# Patient Record
Sex: Female | Born: 1985
Health system: Southern US, Community
[De-identification: ages and names within clinical notes are randomized; demographics above are authoritative.]

## PROBLEM LIST (undated history)

## (undated) ENCOUNTER — Ambulatory Visit: Payer: No Typology Code available for payment source | Source: Home / Self Care

## (undated) DIAGNOSIS — G4733 Obstructive sleep apnea (adult) (pediatric): Secondary | ICD-10-CM

## (undated) DIAGNOSIS — R7989 Other specified abnormal findings of blood chemistry: Secondary | ICD-10-CM

## (undated) DIAGNOSIS — N946 Dysmenorrhea, unspecified: Secondary | ICD-10-CM

## (undated) DIAGNOSIS — Z8719 Personal history of other diseases of the digestive system: Secondary | ICD-10-CM

## (undated) DIAGNOSIS — M1712 Unilateral primary osteoarthritis, left knee: Secondary | ICD-10-CM

## (undated) DIAGNOSIS — E119 Type 2 diabetes mellitus without complications: Secondary | ICD-10-CM

## (undated) DIAGNOSIS — Z862 Personal history of diseases of the blood and blood-forming organs and certain disorders involving the immune mechanism: Secondary | ICD-10-CM

## (undated) DIAGNOSIS — J45909 Unspecified asthma, uncomplicated: Secondary | ICD-10-CM

## (undated) DIAGNOSIS — A64 Unspecified sexually transmitted disease: Secondary | ICD-10-CM

## (undated) DIAGNOSIS — A599 Trichomoniasis, unspecified: Secondary | ICD-10-CM

## (undated) DIAGNOSIS — R87619 Unspecified abnormal cytological findings in specimens from cervix uteri: Secondary | ICD-10-CM

## (undated) HISTORY — DX: Trichomoniasis, unspecified: A59.9

## (undated) HISTORY — DX: Other specified abnormal findings of blood chemistry: R79.89

## (undated) HISTORY — DX: Unspecified sexually transmitted disease: A64

## (undated) HISTORY — DX: Dysmenorrhea, unspecified: N94.6

## (undated) HISTORY — PX: KNEE SURGERY: SHX244

## (undated) HISTORY — PX: HERNIA REPAIR: SHX51

## (undated) HISTORY — DX: Unilateral primary osteoarthritis, left knee: M17.12

## (undated) HISTORY — DX: Unspecified abnormal cytological findings in specimens from cervix uteri: R87.619

## (undated) HISTORY — PX: LAPAROSCOPIC GASTRIC BANDING: SHX1100

## (undated) HISTORY — PX: FOOT SURGERY: SHX648

## (undated) HISTORY — PX: CHOLECYSTECTOMY: SHX55

---

## 2017-06-22 ENCOUNTER — Emergency Department (HOSPITAL_COMMUNITY): Payer: Medicaid Other

## 2017-06-22 ENCOUNTER — Encounter (HOSPITAL_COMMUNITY): Payer: Self-pay

## 2017-06-22 ENCOUNTER — Emergency Department (HOSPITAL_COMMUNITY)
Admission: EM | Admit: 2017-06-22 | Discharge: 2017-06-22 | Disposition: A | Discharge status: Home / Self Care | Payer: Medicaid Other | Attending: Emergency Medicine | Admitting: Emergency Medicine

## 2017-06-22 DIAGNOSIS — R1110 Vomiting, unspecified: Secondary | ICD-10-CM | POA: Diagnosis not present

## 2017-06-22 DIAGNOSIS — Z79899 Other long term (current) drug therapy: Secondary | ICD-10-CM | POA: Insufficient documentation

## 2017-06-22 DIAGNOSIS — E119 Type 2 diabetes mellitus without complications: Secondary | ICD-10-CM | POA: Diagnosis not present

## 2017-06-22 DIAGNOSIS — Z4651 Encounter for fitting and adjustment of gastric lap band: Secondary | ICD-10-CM | POA: Diagnosis not present

## 2017-06-22 DIAGNOSIS — J45909 Unspecified asthma, uncomplicated: Secondary | ICD-10-CM | POA: Insufficient documentation

## 2017-06-22 HISTORY — DX: Type 2 diabetes mellitus without complications: E11.9

## 2017-06-22 HISTORY — DX: Unspecified asthma, uncomplicated: J45.909

## 2017-06-22 LAB — CBC WITH DIFFERENTIAL/PLATELET
Basophils Absolute: 0 10*3/uL (ref 0.0–0.1)
Basophils Relative: 0 %
Eosinophils Absolute: 0.1 10*3/uL (ref 0.0–0.7)
Eosinophils Relative: 1 %
HCT: 40.1 % (ref 36.0–46.0)
Hemoglobin: 13.7 g/dL (ref 12.0–15.0)
Lymphocytes Relative: 47 %
Lymphs Abs: 3.1 10*3/uL (ref 0.7–4.0)
MCH: 29.8 pg (ref 26.0–34.0)
MCHC: 34.2 g/dL (ref 30.0–36.0)
MCV: 87.2 fL (ref 78.0–100.0)
Monocytes Absolute: 0.5 10*3/uL (ref 0.1–1.0)
Monocytes Relative: 8 %
Neutro Abs: 2.9 10*3/uL (ref 1.7–7.7)
Neutrophils Relative %: 44 %
Platelets: 211 10*3/uL (ref 150–400)
RBC: 4.6 MIL/uL (ref 3.87–5.11)
RDW: 14.6 % (ref 11.5–15.5)
WBC: 6.5 10*3/uL (ref 4.0–10.5)

## 2017-06-22 LAB — COMPREHENSIVE METABOLIC PANEL
ALT: 12 U/L — ABNORMAL LOW (ref 14–54)
AST: 16 U/L (ref 15–41)
Albumin: 4.3 g/dL (ref 3.5–5.0)
Alkaline Phosphatase: 42 U/L (ref 38–126)
Anion gap: 9 (ref 5–15)
BUN: 8 mg/dL (ref 6–20)
CO2: 24 mmol/L (ref 22–32)
Calcium: 9.3 mg/dL (ref 8.9–10.3)
Chloride: 106 mmol/L (ref 101–111)
Creatinine, Ser: 0.97 mg/dL (ref 0.44–1.00)
GFR calc Af Amer: >60 mL/min (ref >60)
GFR calc non Af Amer: >60 mL/min (ref >60)
Glucose, Bld: 91 mg/dL (ref 65–99)
Potassium: 3.6 mmol/L (ref 3.5–5.1)
Sodium: 139 mmol/L (ref 135–145)
Total Bilirubin: 1.2 mg/dL (ref 0.3–1.2)
Total Protein: 7.7 g/dL (ref 6.5–8.1)

## 2017-06-22 LAB — LIPASE, BLOOD: Lipase: 13 U/L (ref 11–51)

## 2017-06-22 LAB — POC URINE PREG, ED: Preg Test, Ur: NEGATIVE

## 2017-06-22 MED ORDER — LIDOCAINE HCL (PF) 1 % IJ SOLN
10.0000 mg | Freq: Once | INTRAMUSCULAR | Status: AC
  Administered 2017-06-22: 10 mg
  Filled 2017-06-22: qty 30

## 2017-06-22 MED ORDER — THIAMINE HCL 100 MG/ML IJ SOLN
Freq: Once | INTRAVENOUS | Status: AC
  Administered 2017-06-22: 17:00:00 via INTRAVENOUS
  Filled 2017-06-22: qty 1000

## 2017-06-22 NOTE — ED Notes (Addendum)
Pt declining blood draw or IV placement. States that she wants to speak to the doctor again. MD Criss AlvineGoldston made aware.

## 2017-06-22 NOTE — ED Notes (Signed)
ED Provider at bedside. 

## 2017-06-22 NOTE — ED Notes (Signed)
MD in room

## 2017-06-22 NOTE — ED Notes (Signed)
Pt aware urine sample is needed and cup has been provided

## 2017-06-22 NOTE — ED Notes (Signed)
Pt now agrees to blood draw and IV.

## 2017-06-22 NOTE — ED Notes (Addendum)
Pt ambulated to CT

## 2017-06-22 NOTE — ED Provider Notes (Signed)
[facility]-EMERGENCY DEPT Provider Note   CSN: [id number] Arrival date & time: 06/22/17  1409     History   Chief Complaint Chief Complaint  Patient presents with  . Emesis    HPI Julia Davis is a 31 y.o. female.  HPI  31 year old female with vomiting for about one week. She states she had a LAP-BAND in 2013 in [place]. 3 weeks ago she had it filled. She waited about one week before moving down here because she has had issues with it being overfilled before. She had no symptoms and thus moved down here without difficulty. However over the last 1-2 weeks she's been having progressive trouble swallowing. Over the last couple days it's been to the point that she cannot swallow water. She states the water feels like it goes all the way down her chest into her stomach and then comes back up. She denies any abdominal pain or distention. No hematemesis.  Past Medical History:  Diagnosis Date  . Asthma    mild  . Diabetes mellitus without complication (HCC)    gestational only    There are no active problems to display for this patient.   Past Surgical History:  Procedure Laterality Date  . CESAREAN SECTION    . CHOLECYSTECTOMY    . FOOT SURGERY Left   . KNEE SURGERY Right   . LAPAROSCOPIC GASTRIC BANDING      OB History    No data available       Home Medications    Prior to Admission medications   Medication Sig Start Date End Date Taking? Authorizing Provider  acetaminophen-codeine (TYLENOL #3) 300-30 MG tablet Take 1 tablet by mouth every 4 (four) hours as needed for moderate pain.   Yes [provider]  cetirizine (ZYRTEC) 10 MG tablet Take 10 mg by mouth daily.   Yes [provider]  levonorgestrel (MIRENA) 20 MCG/24HR IUD 1 each by Intrauterine route once.   Yes [provider]    Family History History reviewed. No pertinent family history.  Social History Social History  Substance Use Topics  . Smoking status: Never Smoker  .  Smokeless tobacco: Never Used  . Alcohol use 1.2 oz/week    2 Glasses of wine per week     Allergies   Patient has no known allergies.   Review of Systems Review of Systems  Constitutional: Negative for fever.  Gastrointestinal: Positive for vomiting. Negative for abdominal distention, abdominal pain and nausea.  Genitourinary: Negative for menstrual problem.  Musculoskeletal: Negative for back pain.  All other systems reviewed and are negative.    Physical Exam Updated Vital Signs BP (!) 150/104 (BP Location: Left Arm)   Pulse (!) 57   Temp 98.5 F (36.9 C) (Oral)   Resp 14   Ht 5\' 9"  (1.753 m)   Wt 122.5 kg (270 lb)   LMP 05/29/2017 (Approximate) Comment: Patient has the mirena IUD. pt has not had sex since before her last period.  SpO2 100%   BMI 39.87 kg/m   Physical Exam  Constitutional: She is oriented to person, place, and time. She appears well-developed and well-nourished. No distress.  obese  HENT:  Head: Normocephalic and atraumatic.  Right Ear: External ear normal.  Left Ear: External ear normal.  Nose: Nose normal.  Eyes: Right eye exhibits no discharge. Left eye exhibits no discharge.  Cardiovascular: Normal rate, regular rhythm and normal heart sounds.   Pulmonary/Chest: Effort normal and breath sounds normal.  Abdominal: Soft. She  exhibits no distension. There is no tenderness.  LUQ scar, well healed. LUQ subcutaneous mass present (c/w lap band), no tenderness  Neurological: She is alert and oriented to person, place, and time.  Skin: Skin is warm and dry. She is not diaphoretic.  Nursing note and vitals reviewed.    ED Treatments / Results  Labs (all labs ordered are listed, but only abnormal results are displayed) Labs Reviewed  COMPREHENSIVE METABOLIC PANEL - Abnormal; Notable for the following:       Result Value   ALT 12 (*)    All other components within normal limits  LIPASE, BLOOD  CBC WITH DIFFERENTIAL/PLATELET  POC URINE PREG,  ED    EKG  EKG Interpretation None       Radiology Dg Abd 1 View  Result Date: 06/22/2017 CLINICAL DATA:  31 year old with personal history of a laparoscopic gastric band procedure, presenting with upper abdominal pain, nausea and vomiting. Patient believes that the band may be too tight. EXAM: ABDOMEN - 1 VIEW COMPARISON:  None. FINDINGS: The laparoscopic band is appropriately oriented in the 1:30 o'clock to the 7:30 o'clock position. The catheter from the subcutaneous port in the left mid abdomen to the band is intact. Bowel gas pattern unremarkable without evidence of obstruction or significant ileus. Expected stool burden in the colon. Phleboliths in the left side of the pelvis. No visible opaque urinary tract calculate. T-shaped intrauterine device in the midline of the pelvis. Regional skeleton intact. IMPRESSION: No acute abdominal abnormality. No visible complicating features related to the laparoscopic gastric band. Electronically Signed   By: Hulan Saashomas  Lawrence M.D.   On: 06/22/2017 16:28    Procedures Procedures (including critical care time)  Medications Ordered in ED Medications  sodium chloride 0.9 % 1,000 mL with thiamine 100 mg, folic acid 1 mg, multivitamins adult 10 mL infusion ( Intravenous New Bag/Given 06/22/17 1640)  lidocaine (PF) (XYLOCAINE) 1 % injection 10 mg (10 mg Other Given 06/22/17 1732)     Initial Impression / Assessment and Plan / ED Course  I have reviewed the triage vital signs and the nursing notes.  Pertinent labs & imaging results that were available during my care of the patient were reviewed by me and considered in my medical decision making (see chart for details).     Patient's presentation is most consistent with overinflation of the lap band. Discussed with Dr. Carolynne Edouardoth, who evaluated patient and removed fluid from her lap band. She is now able to tolerate oral fluids without difficulty. Thus, she will be discharged to follow-up with her [place]  bariatric surgeon, she is going back up there before the end of the month. Otherwise well appearing. Labwork unremarkable, is not pregnant. No indication for CT at this time given resolution of symptoms. Discussed return precautions.  Final Clinical Impressions(s) / ED Diagnoses   Final diagnoses:  Encounter for adjustment of gastric lap band  Vomiting in adult    New Prescriptions New Prescriptions   No medications on file     Pricilla LovelessGoldston, Zonie Crutcher, MD 06/22/17 [phone]

## 2017-06-22 NOTE — Consult Note (Signed)
Reason for Consult:not able to swallow Referring Physician: Dr. Tonna Boehringer is an 31 y.o. female.  HPI: The patient is a 31 yo black female who had a lap band placed 5 years ago in [place]. She had a fill about 3 weeks ago. Then she moved down here. Over last week she has been having difficulty swallowing. Denies pain. Now can't swallow liquid.  Past Medical History:  Diagnosis Date  . Asthma    mild  . Diabetes mellitus without complication (HCC)    gestational only    Past Surgical History:  Procedure Laterality Date  . CESAREAN SECTION    . CHOLECYSTECTOMY    . FOOT SURGERY Left   . KNEE SURGERY Right   . LAPAROSCOPIC GASTRIC BANDING      History reviewed. No pertinent family history.  Social History:  reports that she has never smoked. She has never used smokeless tobacco. She reports that she drinks about 1.2 oz of alcohol per week . She reports that she does not use drugs.  Allergies: No Known Allergies  Medications: I have reviewed the patient's current medications.  Results for orders placed or performed during the hospital encounter of 06/22/17 (from the past 48 hour(s))  Lipase, blood     Status: None   Collection Time: 06/22/17  4:28 PM  Result Value Ref Range   Lipase 13 11 - 51 U/L  Comprehensive metabolic panel     Status: Abnormal   Collection Time: 06/22/17  4:28 PM  Result Value Ref Range   Sodium 139 135 - 145 mmol/L   Potassium 3.6 3.5 - 5.1 mmol/L   Chloride 106 101 - 111 mmol/L   CO2 24 22 - 32 mmol/L   Glucose, Bld 91 65 - 99 mg/dL   BUN 8 6 - 20 mg/dL   Creatinine, Ser 0.97 0.44 - 1.00 mg/dL   Calcium 9.3 8.9 - 10.3 mg/dL   Total Protein 7.7 6.5 - 8.1 g/dL   Albumin 4.3 3.5 - 5.0 g/dL   AST 16 15 - 41 U/L   ALT 12 (L) 14 - 54 U/L   Alkaline Phosphatase 42 38 - 126 U/L   Total Bilirubin 1.2 0.3 - 1.2 mg/dL   GFR calc non Af Amer >60 >60 mL/min   GFR calc Af Amer >60 >60 mL/min    Comment: (NOTE) The eGFR has been calculated  using the CKD EPI equation. This calculation has not been validated in all clinical situations. eGFR's persistently <60 mL/min signify possible Chronic Kidney Disease.    Anion gap 9 5 - 15  CBC with Differential     Status: None   Collection Time: 06/22/17  4:28 PM  Result Value Ref Range   WBC 6.5 4.0 - 10.5 K/uL   RBC 4.60 3.87 - 5.11 MIL/uL   Hemoglobin 13.7 12.0 - 15.0 g/dL   HCT 40.1 36.0 - 46.0 %   MCV 87.2 78.0 - 100.0 fL   MCH 29.8 26.0 - 34.0 pg   MCHC 34.2 30.0 - 36.0 g/dL   RDW 14.6 11.5 - 15.5 %   Platelets 211 150 - 400 K/uL   Neutrophils Relative % 44 %   Neutro Abs 2.9 1.7 - 7.7 K/uL   Lymphocytes Relative 47 %   Lymphs Abs 3.1 0.7 - 4.0 K/uL   Monocytes Relative 8 %   Monocytes Absolute 0.5 0.1 - 1.0 K/uL   Eosinophils Relative 1 %   Eosinophils Absolute 0.1 0.0 - 0.7  K/uL   Basophils Relative 0 %   Basophils Absolute 0.0 0.0 - 0.1 K/uL    Dg Abd 1 View  Result Date: 06/22/2017 CLINICAL DATA:  31 year old with personal history of a laparoscopic gastric band procedure, presenting with upper abdominal pain, nausea and vomiting. Patient believes that the band may be too tight. EXAM: ABDOMEN - 1 VIEW COMPARISON:  None. FINDINGS: The laparoscopic band is appropriately oriented in the 1:30 o'clock to the 7:30 o'clock position. The catheter from the subcutaneous port in the left mid abdomen to the band is intact. Bowel gas pattern unremarkable without evidence of obstruction or significant ileus. Expected stool burden in the colon. Phleboliths in the left side of the pelvis. No visible opaque urinary tract calculate. T-shaped intrauterine device in the midline of the pelvis. Regional skeleton intact. IMPRESSION: No acute abdominal abnormality. No visible complicating features related to the laparoscopic gastric band. Electronically Signed   By: Evangeline Dakin M.D.   On: 06/22/2017 16:28    Review of Systems  Constitutional: Negative.   HENT: Negative.   Eyes:  Negative.   Respiratory: Negative.   Cardiovascular: Negative.   Gastrointestinal: Positive for nausea and vomiting. Negative for abdominal pain.  Genitourinary: Negative.   Musculoskeletal: Negative.   Skin: Negative.   Neurological: Negative.   Endo/Heme/Allergies: Negative.   Psychiatric/Behavioral: Negative.    Blood pressure 107/82, pulse 67, temperature 98.5 F (36.9 C), temperature source Oral, resp. rate 14, height _0  (1.753 m), weight 122.5 kg (270 lb), last menstrual period 05/29/2017, SpO2 100 %. Physical Exam  Constitutional: She is oriented to person, place, and time. She appears well-developed and well-nourished. No distress.  HENT:  Head: Normocephalic and atraumatic.  Mouth/Throat: No oropharyngeal exudate.  Eyes: Conjunctivae and EOM are normal. Pupils are equal, round, and reactive to light.  Neck: Normal range of motion. Neck supple.  Cardiovascular: Normal rate, regular rhythm and normal heart sounds.   Respiratory: Effort normal and breath sounds normal. No stridor. No respiratory distress.  GI: Soft. Bowel sounds are normal. There is no tenderness.  Musculoskeletal: Normal range of motion. She exhibits no edema or deformity.  Neurological: She is alert and oriented to person, place, and time. Coordination normal.  Skin: Skin is warm and dry. No rash noted.  Psychiatric: She has a normal mood and affect. Her behavior is normal. Thought content normal.    Assessment/Plan: The patient may have too much fluid in band. I prepped her skin with chlorhexidine and aspirated 8cc of fluid from band. If she can swallow she can go home with follow up to her bariatric doctor.  TOTH III,Carleigh Buccieri S 06/22/2017, 5:24 PM

## 2017-06-22 NOTE — ED Notes (Signed)
Pt reminded of need for urine sample.  

## 2017-06-22 NOTE — ED Notes (Signed)
TOTH MD Provider at bedside.

## 2017-06-22 NOTE — ED Triage Notes (Signed)
Pt complains of vomiting any time she eats. This has been going on for a week, she came in today because she is not longer able to keep liquids down. Denies nausea or pain. Patient has a lap band placed in 2013.

## 2018-12-18 DIAGNOSIS — R7989 Other specified abnormal findings of blood chemistry: Secondary | ICD-10-CM

## 2018-12-18 DIAGNOSIS — A599 Trichomoniasis, unspecified: Secondary | ICD-10-CM

## 2018-12-18 HISTORY — DX: Trichomoniasis, unspecified: A59.9

## 2018-12-18 HISTORY — DX: Other specified abnormal findings of blood chemistry: R79.89

## 2019-01-15 ENCOUNTER — Other Ambulatory Visit (HOSPITAL_COMMUNITY)
Admission: RE | Admit: 2019-01-15 | Discharge: 2019-01-15 | Disposition: A | Discharge status: Home / Self Care | Payer: 59 | Source: Ambulatory Visit | Attending: Obstetrics and Gynecology | Admitting: Obstetrics and Gynecology

## 2019-01-15 ENCOUNTER — Encounter: Payer: Self-pay | Admitting: Obstetrics and Gynecology

## 2019-01-15 ENCOUNTER — Ambulatory Visit (INDEPENDENT_AMBULATORY_CARE_PROVIDER_SITE_OTHER): Payer: 59 | Admitting: Obstetrics and Gynecology

## 2019-01-15 ENCOUNTER — Telehealth: Payer: Self-pay | Admitting: Obstetrics and Gynecology

## 2019-01-15 ENCOUNTER — Other Ambulatory Visit: Payer: Self-pay

## 2019-01-15 VITALS — BP 120/84 | HR 70 | Resp 14 | Ht 67.5 in | Wt 304.6 lb

## 2019-01-15 DIAGNOSIS — Z01419 Encounter for gynecological examination (general) (routine) without abnormal findings: Secondary | ICD-10-CM

## 2019-01-15 DIAGNOSIS — E559 Vitamin D deficiency, unspecified: Secondary | ICD-10-CM | POA: Diagnosis not present

## 2019-01-15 DIAGNOSIS — Z113 Encounter for screening for infections with a predominantly sexual mode of transmission: Secondary | ICD-10-CM | POA: Diagnosis present

## 2019-01-15 DIAGNOSIS — T8332XA Displacement of intrauterine contraceptive device, initial encounter: Secondary | ICD-10-CM

## 2019-01-15 DIAGNOSIS — Z1159 Encounter for screening for other viral diseases: Secondary | ICD-10-CM | POA: Insufficient documentation

## 2019-01-15 MED ORDER — VALACYCLOVIR HCL 500 MG PO TABS: 500.0000 mg | ORAL_TABLET | Freq: Two times a day (BID) | ORAL | 3 refills | Status: DC

## 2019-01-15 NOTE — Telephone Encounter (Signed)
Patient returning call.

## 2019-01-15 NOTE — Progress Notes (Signed)
33 y.o. [id number] Single African American female here for annual exam.    Patient also complaining of unusual vaginal discharge with odor. Wants a pap and STD check.  Had unprotected sex.   Thinking about tubal ligation.  Feels moody with her Mirena IUD.  Declines future childbearing.   Light menses with Mirena, every 28 - 30 days.   Gained 30 pounds since moving to [place].  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  Studying business administration.  Moved from [place].  PCP: None  Patient's last menstrual period was 01/01/2019 (approximate).           Sexually active: Yes.   Female preference. The current method of family planning is IUD--Mirena inserted ~07/2016.    Exercising: Yes.    cardio and strength Smoker:  Yes, smokes 3-4 cigarettes/day  Health Maintenance: Pap:  2018 normal per patient in [place] History of abnormal Pap:  Yes, had abnormal pap age 33 with negative colposcopy--no treatment. MMG:  n/a Colonoscopy:  n/a BMD:   n/a  Result  n/a TDaP:  05/2018 Gardasil:   no HIV: 4-5 years ago--Neg Hep C: 4-5 years ago--Neg Screening Labs:  ---   reports that she has been smoking cigarettes. She has been smoking about 0.25 packs per day. She has never used smokeless tobacco. She reports current alcohol use of about 5.0 standard drinks of alcohol per week. She reports current drug use. Drug: Marijuana.  Past Medical History:  Diagnosis Date  . Abnormal Pap smear of cervix    age 60-neg colposcopy--no treatment  . Arthritis of knee, left   . Asthma    mild  . Diabetes mellitus without complication (HCC)    gestational only  . Dysmenorrhea   . STD (sexually transmitted disease)    Hx HSV II    Past Surgical History:  Procedure Laterality Date  . CESAREAN SECTION  2006, 2012   delivered in [place]  . CHOLECYSTECTOMY    . FOOT SURGERY Left   . KNEE SURGERY Right   . LAPAROSCOPIC GASTRIC BANDING      Current Outpatient Medications  Medication Sig Dispense Refill  .  levonorgestrel (MIRENA) 20 MCG/24HR IUD 1 each by Intrauterine route once.     No current facility-administered medications for this visit.     Family History  Problem Relation Age of Onset  . Osteoarthritis Mother   . Diabetes Father   . Diabetes Maternal Grandfather   . Hypertension Maternal Grandfather     Review of Systems  All other systems reviewed and are negative.   Exam:   BP 120/84 (BP Location: Right Arm, Patient Position: Sitting, Cuff Size: Large)   Pulse 70   Resp 14   Ht 5' 7.5" (1.715 m)   Wt (!) 304 lb 9.6 oz (138.2 kg)   LMP 01/01/2019 (Approximate)   BMI 47.00 kg/m     General appearance: alert, cooperative and appears stated age Head: Normocephalic, without obvious abnormality, atraumatic Neck: no adenopathy, supple, symmetrical, trachea midline and thyroid normal to inspection and palpation Lungs: clear to auscultation bilaterally Breasts: normal appearance, no masses or tenderness, No nipple retraction or dimpling, No nipple discharge or bleeding, No axillary or supraclavicular adenopathy Heart: regular rate and rhythm Abdomen: soft, non-tender; no masses, no organomegaly Extremities: extremities normal, atraumatic, no cyanosis or edema Skin: Skin color, texture, turgor normal. No rashes or lesions Lymph nodes: Cervical, supraclavicular, and axillary nodes normal. No abnormal inguinal nodes palpated Neurologic: Grossly normal  Pelvic: External genitalia:  no lesions              Urethra:  normal appearing urethra with no masses, tenderness or lesions              Bartholins and Skenes: normal                 Vagina: normal appearing vagina with normal color and discharge, no lesions              Cervix: no lesions.  Strings not seen.              Pap taken: Yes.   Bimanual Exam:  Uterus:  normal size, contour, position, consistency, mobility, non-tender              Adnexa: no mass, fullness, tenderness             Chaperone was present for  exam.  Assessment:   Well woman visit with normal exam. Mirena IUD.  Location of IUD? Remote hx abnormal pap.  Vaginitis.  Hx HSV II.  Rare outbreak.  Smoker.  Status post lap band.   Plan: Mammogram screening. Recommended self breast awareness. Pap and HR HPV as above. Guidelines for Calcium, Vitamin D, regular exercise program including cardiovascular and weight bearing exercise. Rx for valtrex prn.  STD screening.  Routine labs.  Vaginitis testing.  Return for pelvic ultrasound to confirm IUD location.  Discussed flu vaccine.  Follow up annually and prn.    After visit summary provided.

## 2019-01-15 NOTE — Patient Instructions (Signed)

## 2019-01-15 NOTE — Progress Notes (Signed)
Patient scheduled while in office for PUS on 01/16/19 at 10:30am, consult to follow at 11am with Dr. Edward Jolly. Order placed for precert. Patient verbalizes understanding and is agreeable.

## 2019-01-15 NOTE — Telephone Encounter (Signed)
Call to patient. Patient called to cancel ultrasound appointment for tomorrow.  Stressed importance of pelvic ultrasound to visualize IUD location and position.  Offered payment options and alternate locations.  Patient will call [facility] for estimate and let me know how to proceed.

## 2019-01-16 ENCOUNTER — Other Ambulatory Visit: Payer: 59

## 2019-01-16 ENCOUNTER — Other Ambulatory Visit: Payer: Self-pay | Admitting: Obstetrics and Gynecology

## 2019-01-17 LAB — CBC
Hematocrit: 42 % (ref 34.0–46.6)
Hemoglobin: 13.9 g/dL (ref 11.1–15.9)
MCH: 30.3 pg (ref 26.6–33.0)
MCHC: 33.1 g/dL (ref 31.5–35.7)
MCV: 92 fL (ref 79–97)
Platelets: 216 10*3/uL (ref 150–450)
RBC: 4.59 x10E6/uL (ref 3.77–5.28)
RDW: 13.9 % (ref 11.7–15.4)
WBC: 5.1 10*3/uL (ref 3.4–10.8)

## 2019-01-17 LAB — COMPREHENSIVE METABOLIC PANEL
ALT: 14 IU/L (ref 0–32)
AST: 17 IU/L (ref 0–40)
Albumin/Globulin Ratio: 1.8 (ref 1.2–2.2)
Albumin: 4.4 g/dL (ref 3.8–4.8)
Alkaline Phosphatase: 52 IU/L (ref 39–117)
BUN/Creatinine Ratio: 11 (ref 9–23)
BUN: 10 mg/dL (ref 6–20)
Bilirubin Total: 0.5 mg/dL (ref 0.0–1.2)
CO2: 22 mmol/L (ref 20–29)
Calcium: 9 mg/dL (ref 8.7–10.2)
Chloride: 103 mmol/L (ref 96–106)
Creatinine, Ser: 0.91 mg/dL (ref 0.57–1.00)
GFR calc Af Amer: 95 mL/min/{1.73_m2} (ref >59)
GFR calc non Af Amer: 83 mL/min/{1.73_m2} (ref >59)
Globulin, Total: 2.5 g/dL (ref 1.5–4.5)
Glucose: 81 mg/dL (ref 65–99)
Potassium: 4.2 mmol/L (ref 3.5–5.2)
Sodium: 141 mmol/L (ref 134–144)
Total Protein: 6.9 g/dL (ref 6.0–8.5)

## 2019-01-17 LAB — HEP, RPR, HIV PANEL
HIV Screen 4th Generation wRfx: NONREACTIVE
Hepatitis B Surface Ag: NEGATIVE
RPR Ser Ql: REACTIVE — AB

## 2019-01-17 LAB — LIPID PANEL
Chol/HDL Ratio: 2.1 ratio (ref 0.0–4.4)
Cholesterol, Total: 145 mg/dL (ref 100–199)
HDL: 68 mg/dL (ref >39)
LDL Calculated: 67 mg/dL (ref 0–99)
Triglycerides: 48 mg/dL (ref 0–149)
VLDL Cholesterol Cal: 10 mg/dL (ref 5–40)

## 2019-01-17 LAB — TSH: TSH: 1.55 u[IU]/mL (ref 0.450–4.500)

## 2019-01-17 LAB — VITAMIN D 25 HYDROXY (VIT D DEFICIENCY, FRACTURES): Vit D, 25-Hydroxy: 11.3 ng/mL — ABNORMAL LOW (ref 30.0–100.0)

## 2019-01-17 LAB — HEPATITIS C ANTIBODY: Hep C Virus Ab: <0.1 s/co ratio (ref 0.0–0.9)

## 2019-01-17 LAB — RPR, QUANT. (REFLEX): Rapid Plasma Reagin, Quant: 1:1 {titer} — ABNORMAL HIGH

## 2019-01-23 LAB — SPECIMEN STATUS REPORT

## 2019-01-23 LAB — T.PALLIDUM AB, TOTAL: T Pallidum Abs: NONREACTIVE

## 2019-01-24 ENCOUNTER — Encounter: Payer: Self-pay | Admitting: Obstetrics and Gynecology

## 2019-01-24 NOTE — Addendum Note (Signed)
Addended by: Ardell Isaacs, Debbe Bales E on: 01/24/2019 12:39 PM   Modules accepted: Orders

## 2019-01-27 ENCOUNTER — Telehealth: Payer: Self-pay | Admitting: *Deleted

## 2019-01-27 MED ORDER — METRONIDAZOLE 500 MG PO TABS: 500.0000 mg | ORAL_TABLET | Freq: Two times a day (BID) | ORAL | 0 refills | Status: DC

## 2019-01-27 MED ORDER — VITAMIN D (ERGOCALCIFEROL) 1.25 MG (50000 UNIT) PO CAPS: 50000.0000 [IU] | ORAL_CAPSULE | ORAL | 0 refills | Status: DC

## 2019-01-27 NOTE — Telephone Encounter (Signed)
-----   Message from Patton Salles, MD sent at 01/24/2019 12:39 PM EST ----- Please contact patient regarding her lab testing.  She did test positive for bacterial vaginosis and trichomonas.  I am recommending she treat with Flagyl 500 mg po bid x 7 days which will treat both infections.  Any partner needs evaluation and treatment for the trichomonas as well.  She will need retesting for the trichomonas 2 - 4 weeks after she completes her treatment.  Please make this appointment.  This cannot be done on the same day as her pelvic ultrasound unless only water is used for the ultrasound.   She had a false positive RPR test, and her final treponemal antibody test is negative for syphilis.  The treponemal aby test takes several days to be ready.   The remainder of her testing for HIV, hepatitis B and C, gonorrhea, and chlamydia are all negative.   Her Vit D was low at 11.3. I recommend she take vit D 50,000 IU weekly for 3 months and then have a lab recheck of this level.  Please send to her pharmacy and I will place a future order.   Her cholesterol, blood counts, blood chemistries, and thyroid are all normal.  Pap normal and HR HPV negative.  Pap recall - 02.

## 2019-01-27 NOTE — Telephone Encounter (Signed)
Notes recorded by Leda MinHamm, Eufelia Veno N, RN on 01/27/2019 at 10:32 AM EST Left message to call Noreene LarssonJill, RN at [facility] [phone].

## 2019-01-27 NOTE — Telephone Encounter (Signed)
Spoke with patient, advised as seen below per Dr. Edward Jolly. Rx for Vit D and Flagyl to verified pharmacy. ETOH precautions reviewed, patient read back instructions on both medications. OV scheduled for 3/6 at 3:30pm for TOC. Patient declines to schedule PUS or Vit D labs at this time. Patient verbalizes understanding.   02 recall  Routing to provider for final review. Patient is agreeable to disposition. Will close encounter.  Cc: Harland Dingwall

## 2019-01-27 NOTE — Telephone Encounter (Signed)
Patient returned call to nurse Jill. °

## 2019-01-27 NOTE — Telephone Encounter (Signed)
Patient did not come for ultrasound and has not called back regarding options.

## 2019-01-31 LAB — CYTOLOGY - PAP
Bacterial vaginitis: POSITIVE — AB
Candida vaginitis: NEGATIVE
Chlamydia: NEGATIVE
Diagnosis: NEGATIVE
HPV: NOT DETECTED
Neisseria Gonorrhea: NEGATIVE
Trichomonas: POSITIVE — AB

## 2019-02-14 ENCOUNTER — Telehealth: Payer: Self-pay | Admitting: Obstetrics and Gynecology

## 2019-02-14 NOTE — Telephone Encounter (Signed)
Spoke with patient she has not called [facility] yet. She states she will call them on 02/17/19. She will call back once she decides what she wants to do concerning scheduling an ultrasound.

## 2019-02-19 NOTE — Telephone Encounter (Signed)
See next encounter for attempts to schedule ultrasound.

## 2019-02-19 NOTE — Telephone Encounter (Signed)
Routing to Dr. Edward Jolly.   Patient has open ultrasound order for lost IUD threads.  May have ultrasound done at [facility].  Has appointment 02/21/2019 for test of cure for trichomonas.   Needs standard ultrasound letter or wait until follow up?

## 2019-02-20 NOTE — Progress Notes (Signed)
GYNECOLOGY  VISIT   HPI: 33 y.o.   Single  African American  female   [phone] with Patient's last menstrual period was 02/01/2019 (exact date).   here for test of cure for Trich and BV.    Was able to tolerate the Flagyl.  No discharge.  No burning of irritation.   I was unable to see her IUD strings.  No [place] to date here.  She declines.  States her partner can feel this.   Hx low vit D. Taking vit D high dosage.  GYNECOLOGIC HISTORY: Patient's last menstrual period was 02/01/2019 (exact date). Contraception: Mirena IUD 07/2016 Menopausal hormone therapy: Mirena IUD 07/2016 Last mammogram:  n/a Last pap smear: 01-15-19 Neg:Neg HR HPV        OB History    Gravida  2   Para  2   Term  2   Preterm      AB      Living  2     SAB      TAB      Ectopic      Multiple      Live Births                 There are no active problems to display for this patient.   Past Medical History:  Diagnosis Date  . Abnormal Pap smear of cervix    age 81-neg colposcopy--no treatment  . Arthritis of knee, left   . Asthma    mild  . Diabetes mellitus without complication (HCC)    gestational only  . Dysmenorrhea   . Low vitamin D level 2020  . STD (sexually transmitted disease)    Hx HSV II  . Trichomonas infection 2020    Past Surgical History:  Procedure Laterality Date  . CESAREAN SECTION  2006, 2012   delivered in [place]  . CHOLECYSTECTOMY    . FOOT SURGERY Left   . KNEE SURGERY Right   . LAPAROSCOPIC GASTRIC BANDING      Current Outpatient Medications  Medication Sig Dispense Refill  . levonorgestrel (MIRENA) 20 MCG/24HR IUD 1 each by Intrauterine route once.    . valACYclovir (VALTREX) 500 MG tablet Take 1 tablet (500 mg total) by mouth 2 (two) times daily. Take for 3 days  As needed. 30 tablet 3  . Vitamin D, Ergocalciferol, (DRISDOL) 1.25 MG (50000 UT) CAPS capsule Take 1 capsule (50,000 Units total) by mouth every 7 (seven) days. 12 capsule 0   No  current facility-administered medications for this visit.      ALLERGIES: Patient has no known allergies.  Family History  Problem Relation Age of Onset  . Osteoarthritis Mother   . Diabetes Father   . Diabetes Maternal Grandfather   . Hypertension Maternal Grandfather     Social History   Socioeconomic History  . Marital status: Single    Spouse name: Not on file  . Number of children: Not on file  . Years of education: Not on file  . Highest education level: Not on file  Occupational History  . Not on file  Social Needs  . Financial resource strain: Not on file  . Food insecurity:    Worry: Not on file    Inability: Not on file  . Transportation needs:    Medical: Not on file    Non-medical: Not on file  Tobacco Use  . Smoking status: Current Every Day Smoker    Packs/day: 0.25    Types:  Cigarettes  . Smokeless tobacco: Never Used  Substance and Sexual Activity  . Alcohol use: Yes    Alcohol/week: 5.0 standard drinks    Types: 5 Glasses of wine per week  . Drug use: Yes    Types: Marijuana    Comment: occ.  Marland Kitchen Sexual activity: Yes    Birth control/protection: I.U.D.    Comment: Mirena inserted 07/2016  Lifestyle  . Physical activity:    Days per week: Not on file    Minutes per session: Not on file  . Stress: Not on file  Relationships  . Social connections:    Talks on phone: Not on file    Gets together: Not on file    Attends religious service: Not on file    Active member of club or organization: Not on file    Attends meetings of clubs or organizations: Not on file    Relationship status: Not on file  . Intimate partner violence:    Fear of current or ex partner: Not on file    Emotionally abused: Not on file    Physically abused: Not on file    Forced sexual activity: Not on file  Other Topics Concern  . Not on file  Social History Narrative  . Not on file    Review of Systems  All other systems reviewed and are negative.   PHYSICAL  EXAMINATION:    BP 130/72 (BP Location: Right Arm, Patient Position: Sitting, Cuff Size: Large)   Pulse 70   Ht 5' 7.5" (1.715 m)   Wt (!) 305 lb (138.3 kg)   LMP 02/01/2019 (Exact Date)   BMI 47.06 kg/m     General appearance: alert, cooperative and appears stated age Head: Normocephalic, without obvious abnormality, atraumatic  Pelvic: External genitalia:  no lesions              Urethra:  normal appearing urethra with no masses, tenderness or lesions              Bartholins and Skenes: normal                 Vagina: normal appearing vagina with normal color and discharge, no lesions              Cervix: no lesions.  IUD strings seen today 3 - 4 mm length.                 Bimanual Exam:  Uterus:  normal size, contour, position, consistency, mobility, non-tender              Adnexa: no mass, fullness, tenderness                Chaperone was present for exam.  ASSESSMENT  Trichomonas and BV infection.  False positive RPR.  Neg treponemal testing. Low vit D.  Hx gastric band.  Mirena IUD.  Stings seen today.   PLAN  Nuswab testing.  No pelvic US needed for IUD position confirmation. Return for vit D check in mid May 2020.    An After Visit Summary was printed and given to the patient.  __15____ minutes face to face time of which over 50% was spent in counseling.

## 2019-02-20 NOTE — Telephone Encounter (Signed)
I can discuss the ultrasound at her follow up appointment tomorrow.  If she does not keep the appointment, we can send her a letter recommending retesting for infection and proceeding forward with the pelvic ultrasound.

## 2019-02-21 ENCOUNTER — Ambulatory Visit (INDEPENDENT_AMBULATORY_CARE_PROVIDER_SITE_OTHER): Payer: 59 | Admitting: Obstetrics and Gynecology

## 2019-02-21 ENCOUNTER — Other Ambulatory Visit: Payer: Self-pay

## 2019-02-21 ENCOUNTER — Encounter: Payer: Self-pay | Admitting: Obstetrics and Gynecology

## 2019-02-21 VITALS — BP 130/72 | HR 70 | Ht 67.5 in | Wt 305.0 lb

## 2019-02-21 DIAGNOSIS — Z30431 Encounter for routine checking of intrauterine contraceptive device: Secondary | ICD-10-CM

## 2019-02-21 DIAGNOSIS — R7989 Other specified abnormal findings of blood chemistry: Secondary | ICD-10-CM | POA: Diagnosis not present

## 2019-02-21 DIAGNOSIS — A599 Trichomoniasis, unspecified: Secondary | ICD-10-CM

## 2019-02-24 NOTE — Telephone Encounter (Signed)
Per Dr. Edward Jolly cancel order.  Order for PUS discontinued.

## 2019-02-25 LAB — NUSWAB VAGINITIS (VG)
Atopobium vaginae: HIGH Score — AB
BVAB 2: HIGH Score — AB
Candida albicans, NAA: NEGATIVE
Candida glabrata, NAA: NEGATIVE
Trich vag by NAA: NEGATIVE

## 2019-02-26 ENCOUNTER — Other Ambulatory Visit: Payer: Self-pay | Admitting: *Deleted

## 2019-02-26 MED ORDER — METRONIDAZOLE 0.75 % VA GEL: 1.0000 | Freq: Every day | VAGINAL | 0 refills | Status: AC

## 2019-03-18 ENCOUNTER — Telehealth: Payer: Self-pay | Admitting: Obstetrics and Gynecology

## 2019-03-18 NOTE — Telephone Encounter (Signed)
Patient has a question for nurse about her prescription for bacterial vaginosis. The cream is too expensive and would like the pills called in.

## 2019-03-19 MED ORDER — METRONIDAZOLE 500 MG PO TABS: 500.0000 mg | ORAL_TABLET | Freq: Two times a day (BID) | ORAL | 0 refills | Status: DC

## 2019-03-19 NOTE — Telephone Encounter (Signed)
Spoke with patient. Patient states that she was seen 02/21/2019 and has BV. Wanted to try Metrogel this time for treatment, but states it is too costly and has been unable to pick it up. Rx for Flagyl 500 mg po BID x 7 days #14 0RF sent to pharmacy on file. Avoid alcohol during treatment and 24 hours after completing medication. Don't mix with alcohol if mixed can cause severe nausea, vomiting and abdominal cramping.   Routing to provider and will close encounter.

## 2019-05-02 ENCOUNTER — Other Ambulatory Visit: Payer: 59

## 2019-09-19 ENCOUNTER — Other Ambulatory Visit: Payer: Self-pay | Admitting: Surgery

## 2019-09-19 DIAGNOSIS — K9509 Other complications of gastric band procedure: Secondary | ICD-10-CM

## 2019-09-26 ENCOUNTER — Ambulatory Visit
Admission: RE | Admit: 2019-09-26 | Discharge: 2019-09-26 | Disposition: A | Discharge status: Home / Self Care | Payer: 59 | Source: Ambulatory Visit | Attending: Surgery | Admitting: Surgery

## 2019-09-26 ENCOUNTER — Other Ambulatory Visit: Payer: Self-pay | Admitting: Surgery

## 2019-09-26 DIAGNOSIS — K9509 Other complications of gastric band procedure: Secondary | ICD-10-CM

## 2019-12-03 NOTE — H&P (Signed)
Jonn Shingles Documented: 10/15/2019 4:17 PM Location: [place] [facility] Patient #: [id number] DOB: [birth year] Single / Language: Cleophus Molt / Race: Black or African American Female   History of Present Illness Rodman Key B. Hassell Done MD; 10/15/2019 4:54 PM) The patient is a 33 year old female is here for LapBand followup. Ms. Fotopoulos had a AP large lapband placed in [place] in 2013. Her weight is up to 317. She cannot eat sitting down--only standing. Dr. Tery Sanfilippo did an excellent study with thin barium that corroborates this--brisk passage when standing and obstructed with sitting. This has caused maladaptive eating and needs to be removed. Will submit to insurance to see if we can remove the lapband and convert to sleeve gastrectomy. The lapband malfunction is producing maladaptive eating.    Allergies Malachi Bonds, CMA; 10/15/2019 4:17 PM) No Known Drug Allergies [03/29/2018]:  Medication History Malachi Bonds, CMA; 10/15/2019 4:17 PM) Meloxicam (15MG  Tablet, Oral) Active. Naproxen (250MG  Tablet, Oral) Active. Mirena (52 MG) (20MCG/24HR IUD, Intrauterine) Active. ZyrTEC Allergy (10MG  Tablet, Oral) Active. Medications Reconciled  Vitals (Chemira Jones CMA; 10/15/2019 4:17 PM) 10/15/2019 4:17 PM Weight: 317.6 lb Height: 69in Body Surface Area: 2.51 m Body Mass Index: 46.9 kg/m  BP: 136/82 (Sitting, Left Arm, Standard)       Physical Exam (Shawndell Varas B. Hassell Done MD; 10/15/2019 4:55 PM) General Note: obese AAF frustrated by not being able to eat sitting down. has developed maladaptive eating.     Assessment & Plan Rodman Key B. Hassell Done MD; 10/15/2019 4:56 PM) GASTRIC BAND MALFUNCTION (K95.09) Impression: lapband obstruction with sitting--has developed maladaptive eating

## 2019-12-09 ENCOUNTER — Ambulatory Visit (INDEPENDENT_AMBULATORY_CARE_PROVIDER_SITE_OTHER): Payer: 59 | Admitting: Psychology

## 2019-12-09 ENCOUNTER — Inpatient Hospital Stay: Admit: 2019-12-09 | Payer: 59 | Admitting: Surgery

## 2019-12-09 DIAGNOSIS — F509 Eating disorder, unspecified: Secondary | ICD-10-CM | POA: Diagnosis not present

## 2019-12-09 SURGERY — LAPAROSCOPIC GASTRIC BAND REMOVAL WITH LAPAROSCOPIC GASTRIC SLEEVE RESECTION
Anesthesia: General

## 2019-12-18 ENCOUNTER — Encounter

## 2019-12-23 ENCOUNTER — Other Ambulatory Visit: Payer: Self-pay

## 2019-12-23 ENCOUNTER — Encounter: Payer: 59 | Attending: Surgery | Admitting: Skilled Nursing Facility1

## 2019-12-23 DIAGNOSIS — E669 Obesity, unspecified: Secondary | ICD-10-CM | POA: Diagnosis present

## 2019-12-23 NOTE — Progress Notes (Signed)
Nutrition Assessment for Bariatric Surgery Medical Nutrition Therapy Appt Start Time: 9:02   End Time: 10:00  Patient was seen on 12/23/2019 for Pre-Operative Nutrition Assessment. Letter of approval faxed to [facility] bariatric surgery program coordinator on 12/23/2019.   Referral stated Supervised Weight Loss (SWL) visits needed: Referral did not state  Planned surgery: Sleeve Pt expectation of surgery: Achieve a healthy weight, hopes that the sleeve will be a tool to help her reach this goal. Pt expectation of dietitian: Education    NUTRITION ASSESSMENT   Anthropometrics  Start weight at [facility]: 318.3 lbs (date: 12/23/2019)  Height: 69 in BMI:  46.98 kg/m2     Clinical  Medical hx: GDM Medications: Mirena Labs: N/A Notable signs/symptoms: Unable to eat sitting down  Lifestyle & Dietary Hx Pt reports hurting her knee exercising Pt reports currently smoking, previously quit for 4 years but restarted. Pt reports smoking goes hand in hand with alcohol. Pt reports that she would drink out of boredom, because of being out of work for 6 months. Very active at work. 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 Pt reports trying shakes in the morning, but they don't "go down"  24-Hr Dietary Recall First Meal: cup of coffee, cream and splenda Second Meal: none reported Snack: cup of coffee, cream and splenda Third Meal: Black and blue salad from [employer] (blackened chicken, lettuce, tomato, fried onions, blue cheese), water Beverages:  6 oz. Red wine, coffee, water   NUTRITION DIAGNOSIS  Overweight/obesity (McGregor-3.3) related to past poor dietary habits and physical inactivity as evidenced by patient w/ planned sleeve surgery following dietary guidelines for continued weight loss.    NUTRITION INTERVENTION  Nutrition counseling (C-1) and education (E-2) to facilitate bariatric surgery goals.   Pre-Op Goals Reviewed with the Patient . Track food and beverage intake (pen  and paper, MyFitness Pal, Baritastic app, etc.) . Make healthy food choices while monitoring portion sizes . Consume 3 meals per day or try to eat every 3-5 hours . Avoid concentrated sugars and fried foods . Keep sugar & fat in the single digits per serving on food labels . Practice CHEWING your food (aim for applesauce consistency) . Practice not drinking 15 minutes before, during, and 30 minutes after each meal and snack . Avoid all carbonated beverages (ex: soda, sparkling beverages)  . Limit caffeinated beverages (ex: coffee, tea, energy drinks) . Avoid all sugar-sweetened beverages (ex: regular soda, sports drinks)  . Avoid alcohol  . Aim for 64-100 ounces of FLUID daily (with at least half of fluid intake being plain water)  . Aim for at least 60-80 grams of PROTEIN daily . Look for a liquid protein source that contains ?15 g protein and ?5 g carbohydrate (ex: shakes, drinks, shots) . Make a list of non-food related activities . Physical activity is an important part of a healthy lifestyle so keep it moving! The goal is to reach 150 minutes of exercise per week, including cardiovascular and weight baring activity.  *Goals that are bolded indicate the pt would like to start working towards these  Handouts Provided Include  . Bariatric Surgery handouts (Nutrition Visits, Pre-Op Goals, Protein Shakes, Vitamins & Minerals)  Learning Style & Readiness for Change Teaching method utilized: Visual & Auditory  Demonstrated degree of understanding via: Teach Back  Barriers to learning/adherence to lifestyle change: None identified  RD's Notes for Next Visit . Move on to pre-op class     MONITORING & EVALUATION Dietary intake, weekly physical  activity, body weight, and pre-op goals reached at next nutrition visit.    Next Steps  Patient is to follow up at [facility] for Pre-Op Class >2 weeks before surgery for further nutrition education.

## 2019-12-25 ENCOUNTER — Ambulatory Visit (INDEPENDENT_AMBULATORY_CARE_PROVIDER_SITE_OTHER): Payer: 59 | Admitting: Psychology

## 2019-12-25 DIAGNOSIS — F509 Eating disorder, unspecified: Secondary | ICD-10-CM

## 2020-02-26 NOTE — Patient Instructions (Addendum)
DUE TO COVID-19 ONLY ONE VISITOR IS ALLOWED TO COME WITH YOU AND STAY IN THE WAITING ROOM ONLY DURING PRE OP AND PROCEDURE. THE ONE VISITOR MAY VISIT WITH YOU IN YOUR PRIVATE ROOM DURING VISITING HOURS ONLY!!   COVID SWAB TESTING MUST BE COMPLETED ON:  Thursday, March 04, 2020 at 12:15 PM  [address], [place] [facility] enter pre surgical testing line (Must self quarantine after testing. Follow instructions on handout.)             Your procedure is scheduled on: Monday, March 08, 2020   Report to [facility] Main  Entrance   Report to Short Stay at 5:30 AM   [facility](Map Enclosed)   Call this number if you have problems the morning of surgery [phone]   Do not eat food:After Midnight.   May have liquids until 4:30 Am day of surgery   CLEAR LIQUID DIET  Foods Allowed                                                                     Foods Excluded  Water, Black Coffee and tea, regular and decaf                             liquids that you cannot  Plain Jell-O in any flavor  (No red)                                           see through such as: Fruit ices (not with fruit pulp)                                     milk, soups, orange juice  Iced Popsicles (No red)                                    All solid food Carbonated beverages, regular and diet                                    Apple juices Sports drinks like Gatorade (No red) Lightly seasoned clear broth or consume(fat free) Sugar, honey syrup  Sample Menu Breakfast                                Lunch                                     Supper Cranberry juice                    Beef broth  Chicken broth Jell-O                                     Grape juice                           Apple juice Coffee or tea                        Jell-O                                      Popsicle                                                Coffee or tea                         Coffee or tea   Oral Hygiene is also important to reduce your risk of infection.                                    Remember - BRUSH YOUR TEETH THE MORNING OF SURGERY WITH YOUR REGULAR TOOTHPASTE   Do NOT smoke after Midnight   Take these medicines the morning of surgery with A SIP OF WATER: NONE                               You may not have any metal on your body including hair pins, jewelry, and body piercings             Do not wear make-up, lotions, powders, perfumes/cologne, or deodorant             Do not wear nail polish.  Do not shave  48 hours prior to surgery.              Do not bring valuables to the hospital. Friendly.   Contacts, dentures or bridgework may not be worn into surgery.   Bring small overnight bag day of surgery.    Patients discharged the day of surgery will not be allowed to drive home.   Name and phone number of your driver:   Special Instructions: Bring a copy of your healthcare power of attorney and living will documents         the day of surgery if you haven't scanned them in before.              Please read over the following fact sheets you were given:  [facility] - Preparing for Surgery Before surgery, you can play an important role.  Because skin is not sterile, your skin needs to be as free of germs as possible.  You can reduce the number of germs on your skin by washing with CHG (chlorahexidine gluconate) soap before surgery.  CHG is an antiseptic cleaner which kills germs and bonds with the skin  to continue killing germs even after washing. Please DO NOT use if you have an allergy to CHG or antibacterial soaps.  If your skin becomes reddened/irritated stop using the CHG and inform your nurse when you arrive at Short Stay. Do not shave (including legs and underarms) for at least 48 hours prior to the first CHG shower.  You may shave your face/neck.  Please follow these instructions  carefully:  1.  Shower with CHG Soap the night before surgery and the  morning of surgery.  2.  If you choose to wash your hair, wash your hair first as usual with your normal  shampoo.  3.  After you shampoo, rinse your hair and body thoroughly to remove the shampoo.                             4.  Use CHG as you would any other liquid soap.  You can apply chg directly to the skin and wash.  Gently with a scrungie or clean washcloth.  5.  Apply the CHG Soap to your body ONLY FROM THE NECK DOWN.   Do   not use on face/ open                           Wound or open sores. Avoid contact with eyes, ears mouth and   genitals (private parts).                       Wash face,  Genitals (private parts) with your normal soap.             6.  Wash thoroughly, paying special attention to the area where your    surgery  will be performed.  7.  Thoroughly rinse your body with warm water from the neck down.  8.  DO NOT shower/wash with your normal soap after using and rinsing off the CHG Soap.                9.  Pat yourself dry with a clean towel.            10.  Wear clean pajamas.            11.  Place clean sheets on your bed the night of your first shower and do not  sleep with pets. Day of Surgery : Do not apply any lotions/deodorants the morning of surgery.  Please wear clean clothes to the hospital/surgery center.  FAILURE TO FOLLOW THESE INSTRUCTIONS MAY RESULT IN THE CANCELLATION OF YOUR SURGERY  PATIENT SIGNATURE_________________________________  NURSE SIGNATURE__________________________________  ________________________________________________________________________   Julia Davis  An incentive spirometer is a tool that can help keep your lungs clear and active. This tool measures how well you are filling your lungs with each breath. Taking long deep breaths may help reverse or decrease the chance of developing breathing (pulmonary) problems (especially infection) following:  A  long period of time when you are unable to move or be active. BEFORE THE PROCEDURE   If the spirometer includes an indicator to show your best effort, your nurse or respiratory therapist will set it to a desired goal.  If possible, sit up straight or lean slightly forward. Try not to slouch.  Hold the incentive spirometer in an upright position. INSTRUCTIONS FOR USE  1. Sit on the edge of your bed if possible, or sit up as  far as you can in bed or on a chair. 2. Hold the incentive spirometer in an upright position. 3. Breathe out normally. 4. Place the mouthpiece in your mouth and seal your lips tightly around it. 5. Breathe in slowly and as deeply as possible, raising the piston or the ball toward the top of the column. 6. Hold your breath for 3-5 seconds or for as long as possible. Allow the piston or ball to fall to the bottom of the column. 7. Remove the mouthpiece from your mouth and breathe out normally. 8. Rest for a few seconds and repeat Steps 1 through 7 at least 10 times every 1-2 hours when you are awake. Take your time and take a few normal breaths between deep breaths. 9. The spirometer may include an indicator to show your best effort. Use the indicator as a goal to work toward during each repetition. 10. After each set of 10 deep breaths, practice coughing to be sure your lungs are clear. If you have an incision (the cut made at the time of surgery), support your incision when coughing by placing a pillow or rolled up towels firmly against it. Once you are able to get out of bed, walk around indoors and cough well. You may stop using the incentive spirometer when instructed by your caregiver.  RISKS AND COMPLICATIONS  Take your time so you do not get dizzy or light-headed.  If you are in pain, you may need to take or ask for pain medication before doing incentive spirometry. It is harder to take a deep breath if you are having pain. AFTER USE  Rest and breathe slowly and  easily.  It can be helpful to keep track of a log of your progress. Your caregiver can provide you with a simple table to help with this. If you are using the spirometer at home, follow these instructions: SEEK MEDICAL CARE IF:   You are having difficultly using the spirometer.  You have trouble using the spirometer as often as instructed.  Your pain medication is not giving enough relief while using the spirometer.  You develop fever of 100.5 F (38.1 C) or higher. SEEK IMMEDIATE MEDICAL CARE IF:   You cough up bloody sputum that had not been present before.  You develop fever of 102 F (38.9 C) or greater.  You develop worsening pain at or near the incision site. MAKE SURE YOU:   Understand these instructions.  Will watch your condition.  Will get help right away if you are not doing well or get worse. Document Released: 04/16/2007 Document Revised: 02/26/2012 Document Reviewed: 06/17/2007 ExitCare Patient Information 2014 ExitCare, [place].   ________________________________________________________________________  WHAT IS A BLOOD TRANSFUSION? Blood Transfusion Information  A transfusion is the replacement of blood or some of its parts. Blood is made up of multiple cells which provide different functions.  Red blood cells carry oxygen and are used for blood loss replacement.  White blood cells fight against infection.  Platelets control bleeding.  Plasma helps clot blood.  Other blood products are available for specialized needs, such as hemophilia or other clotting disorders. BEFORE THE TRANSFUSION  Who gives blood for transfusions?   Healthy volunteers who are fully evaluated to make sure their blood is safe. This is blood bank blood. Transfusion therapy is the safest it has ever been in the practice of medicine. Before blood is taken from a donor, a complete history is taken to make sure that person has no history of  diseases nor engages in risky social  behavior (examples are intravenous drug use or sexual activity with multiple partners). The donor's travel history is screened to minimize risk of transmitting infections, such as malaria. The donated blood is tested for signs of infectious diseases, such as HIV and hepatitis. The blood is then tested to be sure it is compatible with you in order to minimize the chance of a transfusion reaction. If you or a relative donates blood, this is often done in anticipation of surgery and is not appropriate for emergency situations. It takes many days to process the donated blood. RISKS AND COMPLICATIONS Although transfusion therapy is very safe and saves many lives, the main dangers of transfusion include:   Getting an infectious disease.  Developing a transfusion reaction. This is an allergic reaction to something in the blood you were given. Every precaution is taken to prevent this. The decision to have a blood transfusion has been considered carefully by your caregiver before blood is given. Blood is not given unless the benefits outweigh the risks. AFTER THE TRANSFUSION  Right after receiving a blood transfusion, you will usually feel much better and more energetic. This is especially true if your red blood cells have gotten low (anemic). The transfusion raises the level of the red blood cells which carry oxygen, and this usually causes an energy increase.  The nurse administering the transfusion will monitor you carefully for complications. HOME CARE INSTRUCTIONS  No special instructions are needed after a transfusion. You may find your energy is better. Speak with your caregiver about any limitations on activity for underlying diseases you may have. SEEK MEDICAL CARE IF:   Your condition is not improving after your transfusion.  You develop redness or irritation at the intravenous (IV) site. SEEK IMMEDIATE MEDICAL CARE IF:  Any of the following symptoms occur over the next 12 hours:  Shaking  chills.  You have a temperature by mouth above 102 F (38.9 C), not controlled by medicine.  Chest, back, or muscle pain.  People around you feel you are not acting correctly or are confused.  Shortness of breath or difficulty breathing.  Dizziness and fainting.  You get a rash or develop hives.  You have a decrease in urine output.  Your urine turns a dark color or changes to pink, red, or brown. Any of the following symptoms occur over the next 10 days:  You have a temperature by mouth above 102 F (38.9 C), not controlled by medicine.  Shortness of breath.  Weakness after normal activity.  The white part of the eye turns yellow (jaundice).  You have a decrease in the amount of urine or are urinating less often.  Your urine turns a dark color or changes to pink, red, or brown. Document Released: 12/01/2000 Document Revised: 02/26/2012 Document Reviewed: 07/20/2008 [facility] Patient Information 2014 [place], [place].  _______________________________________________________________________

## 2020-02-27 ENCOUNTER — Encounter: Payer: Self-pay | Admitting: Dietician

## 2020-02-27 ENCOUNTER — Encounter: Payer: 59 | Attending: Surgery | Admitting: Dietician

## 2020-02-27 ENCOUNTER — Other Ambulatory Visit: Payer: Self-pay

## 2020-02-27 DIAGNOSIS — E669 Obesity, unspecified: Secondary | ICD-10-CM | POA: Insufficient documentation

## 2020-02-27 NOTE — Progress Notes (Signed)
Pre-Op Nutrition Class   Bariatric Nutrition Education   Patient was seen on 02/27/2020 for Pre-Operative Bariatric Surgery Education at [facility].    Surgery date: 03/08/2020 Surgery type: LapBand to Sleeve    Start weight at [facility]: 318.3 lbs (date: 12/23/2019) Weight today: 315.3 lbs BMI: 46.6 kg/m2   Samples Given per MNT Protocol (pt educated on appropriate usage) Multivitamin: Bariatric Advantage Multi Chewy Bite Lot #[id number] Exp: 03/08/2020   Calcium: Celebrate Calcium Citrate Chew (Blackberry- Sugar Free) Lot #[id number] Exp: 11/2020   Protein Drink: Unjury High Protein Drink Mix  Lot #[id number]  Exp: 02/2020   The following the learning objectives were met by the patient during this course:  Identify Pre-Op Dietary Goals and begin 2 weeks prior to surgery  Identify appropriate sources of fluids and proteins   State protein recommendations and appropriate sources pre and post-operatively  Identify Post-Operative Dietary Goals and follow for 2 weeks post-operatively  Identify appropriate multivitamin and calcium sources  Describe the need for physical activity post-operatively and will follow MD recommendations  State when to call healthcare provider regarding medication questions or post-operative complications   Handouts given include:  Pre-Op Bariatric Surgery Diet Handout  Protein Shake Handout  Post-Op Bariatric Surgery Nutrition Handout  BELT Program Information Flyer  Support Group Information Flyer  [facility] Bariatric Supplements Price List   Follow-Up Plan: Patient will follow-up at [facility] 2 weeks post operatively for diet advancement per MD.

## 2020-03-01 ENCOUNTER — Encounter (HOSPITAL_COMMUNITY): Payer: Self-pay

## 2020-03-01 ENCOUNTER — Encounter (HOSPITAL_COMMUNITY)
Admission: RE | Admit: 2020-03-01 | Discharge: 2020-03-01 | Disposition: A | Discharge status: Home / Self Care | Payer: 59 | Source: Ambulatory Visit | Attending: Surgery | Admitting: Surgery

## 2020-03-01 ENCOUNTER — Other Ambulatory Visit: Payer: Self-pay

## 2020-03-01 HISTORY — DX: Obstructive sleep apnea (adult) (pediatric): G47.33

## 2020-03-01 HISTORY — DX: Personal history of diseases of the blood and blood-forming organs and certain disorders involving the immune mechanism: Z86.2

## 2020-03-01 HISTORY — DX: Personal history of other diseases of the digestive system: Z87.19

## 2020-03-01 NOTE — Progress Notes (Signed)
PCP - [facility] Cardiologist - N/A  Chest x-ray - N/A EKG - N/A Stress Test - N/A ECHO - N/A Cardiac Cath - N/A  Sleep Study - 2016 CPAP - No longer uses due to weight loss  Fasting Blood Sugar - N/A Checks Blood Sugar __N/A___ times a day  Blood Thinner Instructions: N/A Aspirin Instructions: N/A Last Dose: N/A  Anesthesia review: N/A  Patient denies shortness of breath, fever, cough and chest pain at PAT appointment   Patient verbalized understanding of instructions that were given to them at the PAT appointment. Patient was also instructed that they will need to review over the PAT instructions again at home before surgery.

## 2020-03-02 ENCOUNTER — Ambulatory Visit (HOSPITAL_COMMUNITY)
Admission: RE | Admit: 2020-03-02 | Discharge: 2020-03-02 | Disposition: A | Discharge status: Home / Self Care | Payer: 59 | Source: Ambulatory Visit | Attending: Anesthesiology | Admitting: Anesthesiology

## 2020-03-02 ENCOUNTER — Encounter (HOSPITAL_COMMUNITY)
Admission: RE | Admit: 2020-03-02 | Discharge: 2020-03-02 | Disposition: A | Discharge status: Home / Self Care | Payer: 59 | Source: Ambulatory Visit | Attending: Surgery | Admitting: Surgery

## 2020-03-02 DIAGNOSIS — Z01818 Encounter for other preprocedural examination: Secondary | ICD-10-CM | POA: Insufficient documentation

## 2020-03-02 LAB — CBC WITH DIFFERENTIAL/PLATELET
Abs Immature Granulocytes: 0.02 10*3/uL (ref 0.00–0.07)
Basophils Absolute: 0 10*3/uL (ref 0.0–0.1)
Basophils Relative: 0 %
Eosinophils Absolute: 0.1 10*3/uL (ref 0.0–0.5)
Eosinophils Relative: 1 %
HCT: 42.5 % (ref 36.0–46.0)
Hemoglobin: 13.6 g/dL (ref 12.0–15.0)
Immature Granulocytes: 0 %
Lymphocytes Relative: 35 %
Lymphs Abs: 2 10*3/uL (ref 0.7–4.0)
MCH: 30.2 pg (ref 26.0–34.0)
MCHC: 32 g/dL (ref 30.0–36.0)
MCV: 94.4 fL (ref 80.0–100.0)
Monocytes Absolute: 0.6 10*3/uL (ref 0.1–1.0)
Monocytes Relative: 10 %
Neutro Abs: 3.1 10*3/uL (ref 1.7–7.7)
Neutrophils Relative %: 54 %
Platelets: 227 10*3/uL (ref 150–400)
RBC: 4.5 MIL/uL (ref 3.87–5.11)
RDW: 14.8 % (ref 11.5–15.5)
WBC: 5.8 10*3/uL (ref 4.0–10.5)
nRBC: 0 % (ref 0.0–0.2)

## 2020-03-02 LAB — COMPREHENSIVE METABOLIC PANEL
ALT: 14 U/L (ref 0–44)
AST: 19 U/L (ref 15–41)
Albumin: 3.7 g/dL (ref 3.5–5.0)
Alkaline Phosphatase: 46 U/L (ref 38–126)
Anion gap: 9 (ref 5–15)
BUN: 12 mg/dL (ref 6–20)
CO2: 24 mmol/L (ref 22–32)
Calcium: 9.1 mg/dL (ref 8.9–10.3)
Chloride: 103 mmol/L (ref 98–111)
Creatinine, Ser: 0.94 mg/dL (ref 0.44–1.00)
GFR calc Af Amer: >60 mL/min (ref >60)
GFR calc non Af Amer: >60 mL/min (ref >60)
Glucose, Bld: 92 mg/dL (ref 70–99)
Potassium: 4.6 mmol/L (ref 3.5–5.1)
Sodium: 136 mmol/L (ref 135–145)
Total Bilirubin: 1.2 mg/dL (ref 0.3–1.2)
Total Protein: 6.9 g/dL (ref 6.5–8.1)

## 2020-03-02 LAB — PREGNANCY, URINE: Preg Test, Ur: NEGATIVE

## 2020-03-02 LAB — ABO/RH: ABO/RH(D): O POS

## 2020-03-04 ENCOUNTER — Other Ambulatory Visit (HOSPITAL_COMMUNITY): Payer: 59

## 2020-03-04 ENCOUNTER — Other Ambulatory Visit (HOSPITAL_COMMUNITY)
Admission: RE | Admit: 2020-03-04 | Discharge: 2020-03-04 | Disposition: A | Discharge status: Home / Self Care | Payer: 59 | Source: Ambulatory Visit | Attending: Surgery | Admitting: Surgery

## 2020-03-04 DIAGNOSIS — Z20822 Contact with and (suspected) exposure to covid-19: Secondary | ICD-10-CM | POA: Diagnosis not present

## 2020-03-04 DIAGNOSIS — Z01812 Encounter for preprocedural laboratory examination: Secondary | ICD-10-CM | POA: Diagnosis present

## 2020-03-04 LAB — SARS CORONAVIRUS 2 (TAT 6-24 HRS): SARS Coronavirus 2: NEGATIVE

## 2020-03-07 DIAGNOSIS — Z9884 Bariatric surgery status: Secondary | ICD-10-CM

## 2020-03-07 MED ORDER — BUPIVACAINE LIPOSOME 1.3 % IJ SUSP
20.0000 mL | INTRAMUSCULAR | Status: DC
  Filled 2020-03-07: qty 20

## 2020-03-07 NOTE — H&P (Addendum)
Chief Complaint:  lapband malfunction  History of Present Illness:  Julia Davis is an 34 y.o. female who had a Lapband AP large placed in [place] in 2013.  She cann9ot eat sitting down-only standing.  [facility]I confirms that when she is seated her lapband is obstructed. She presents for removal of lapband and conversion to sleeve gastrectomy.    Past Medical History:  Diagnosis Date  . Abnormal Pap smear of cervix    age 65-neg colposcopy--no treatment  . Arthritis of knee, left   . Asthma    mild, seasonal  . Diabetes mellitus without complication (HCC)    gestational only  . Dysmenorrhea   . History of hiatal hernia    history of  . History of iron deficiency anemia   . Low vitamin D level 2020  . OSA on CPAP    history of, lost weight  . STD (sexually transmitted disease)    Hx HSV II  . Trichomonas infection 2020    Past Surgical History:  Procedure Laterality Date  . CESAREAN SECTION  2006, 2012   delivered in [place]  . CHOLECYSTECTOMY    . FOOT SURGERY Left   . KNEE SURGERY Right    x2  . LAPAROSCOPIC GASTRIC BANDING      Current Facility-Administered Medications  Medication Dose Route Frequency Provider Last Rate Last Admin  . [START ON 03/08/2020] bupivacaine liposome (EXPAREL) 1.3 % injection 266 mg  20 mL Infiltration On Call to OR Luretha Murphy, MD       Current Outpatient Medications  Medication Sig Dispense Refill  . ibuprofen (ADVIL) 200 MG tablet Take 600 mg by mouth every 8 (eight) hours as needed (for pain.).    Marland Kitchen TURMERIC PO Take 1 capsule by mouth daily.    Marland Kitchen levonorgestrel (MIRENA) 20 MCG/24HR IUD 1 each by Intrauterine route once.     Patient has no known allergies. Family History  Problem Relation Age of Onset  . Osteoarthritis Mother   . Diabetes Father   . Diabetes Maternal Grandfather   . Hypertension Maternal Grandfather    Social History:   reports that she has been smoking cigarettes. She has a 3.00 pack-year smoking history. She  has never used smokeless tobacco. She reports current alcohol use of about 5.0 standard drinks of alcohol per week. She reports current drug use. Drug: Marijuana.   REVIEW OF SYSTEMS : Negative except for see problem list and her reported ETOH and marijuana use  Physical Exam:   Last menstrual period 02/18/2020. There is no height or weight on file to calculate BMI.  Gen:  WDWN AAF NAD  Neurological: Alert and oriented to person, place, and time. Motor and sensory function is grossly intact  Head: Normocephalic and atraumatic.  Eyes: Conjunctivae are normal. Pupils are equal, round, and reactive to light. No scleral icterus.  Neck: Normal range of motion. Neck supple. No tracheal deviation or thyromegaly present.  Cardiovascular:  SR without murmurs or gallops.  No carotid bruits Breast:  Not examined Respiratory: Effort normal.  No respiratory distress. No chest wall tenderness. Breath sounds normal.  No wheezes, rales or rhonchi.  Abdomen:  lapband with port in place GU:  Not examined Musculoskeletal: Normal range of motion. Extremities are nontender. No cyanosis, edema or clubbing noted Lymphadenopathy: No cervical, preauricular, postauricular or axillary adenopathy is present Skin: Skin is warm and dry. No rash noted. No diaphoresis. No erythema. No pallor. Pscyh: Normal mood and affect. Behavior is normal. Judgment and thought  content normal.   LABORATORY RESULTS: No results found for this or any previous visit (from the past 48 hour(s)).   RADIOLOGY RESULTS: No results found.  Problem List: Patient Active Problem List   Diagnosis Date Noted  . History of laparoscopic adjustable gastric banding-APL in [facility] 2013 03/07/2020  . Obesity 02/27/2020    Assessment & Plan: Lapband APL (done in [place]) malfunction.  For removal and hopeful conversion to sleeve gastrectomy.  She is aware of the possibility of delay in sleeve depending on state of her stomach.      Matt B.  Hassell Done, MD, [facility] [phone] beeper [phone]  03/07/2020 8:32 AM

## 2020-03-07 NOTE — Anesthesia Preprocedure Evaluation (Addendum)
Anesthesia Evaluation  Patient identified by MRN, date of birth, ID band Patient awake    Reviewed: Allergy & Precautions, NPO status , Patient's Chart, lab work & pertinent test results  Airway Mallampati: II       Dental no notable dental hx. (+) Teeth Intact   Pulmonary sleep apnea and Continuous Positive Airway Pressure Ventilation , Current Smoker,    Pulmonary exam normal breath sounds clear to auscultation       Cardiovascular Normal cardiovascular exam Rhythm:Regular Rate:Normal     Neuro/Psych negative neurological ROS  negative psych ROS   GI/Hepatic Neg liver ROS,   Endo/Other  diabetes, Type obesity  Renal/GU negative Renal ROS  negative genitourinary   Musculoskeletal   Abdominal (+) + obese,   Peds  Hematology   Anesthesia Other Findings   Reproductive/Obstetrics                            Anesthesia Physical Anesthesia Plan  ASA: III  Anesthesia Plan: General   Post-op Pain Management:    Induction: Intravenous  PONV Risk Score and Plan: 4 or greater and Ondansetron, Dexamethasone, Midazolam and Scopolamine patch - Pre-op  Airway Management Planned: Oral ETT  Additional Equipment: None  Intra-op Plan:   Post-operative Plan: Extubation in OR  Informed Consent: I have reviewed the patients History and Physical, chart, labs and discussed the procedure including the risks, benefits and alternatives for the proposed anesthesia with the patient or authorized representative who has indicated his/her understanding and acceptance.     Dental advisory given  Plan Discussed with: CRNA  Anesthesia Plan Comments:        Anesthesia Quick Evaluation

## 2020-03-08 ENCOUNTER — Inpatient Hospital Stay (HOSPITAL_COMMUNITY)
Admission: RE | Admit: 2020-03-08 | Discharge: 2020-03-10 | DRG: 988 | Disposition: A | Discharge status: Home / Self Care | Payer: Medicaid Other | Attending: Surgery | Admitting: Surgery

## 2020-03-08 ENCOUNTER — Encounter (HOSPITAL_COMMUNITY): Payer: Self-pay | Admitting: Surgery

## 2020-03-08 ENCOUNTER — Other Ambulatory Visit: Payer: Self-pay

## 2020-03-08 ENCOUNTER — Inpatient Hospital Stay (HOSPITAL_COMMUNITY): Payer: Medicaid Other | Admitting: Anesthesiology

## 2020-03-08 ENCOUNTER — Encounter (HOSPITAL_COMMUNITY)
Admission: RE | Disposition: A | Discharge status: Home / Self Care | Payer: Self-pay | Source: Home / Self Care | Attending: Surgery

## 2020-03-08 DIAGNOSIS — Z9884 Bariatric surgery status: Secondary | ICD-10-CM

## 2020-03-08 DIAGNOSIS — Z6841 Body Mass Index (BMI) 40.0 and over, adult: Secondary | ICD-10-CM

## 2020-03-08 DIAGNOSIS — I10 Essential (primary) hypertension: Secondary | ICD-10-CM | POA: Diagnosis present

## 2020-03-08 DIAGNOSIS — Z833 Family history of diabetes mellitus: Secondary | ICD-10-CM | POA: Diagnosis not present

## 2020-03-08 DIAGNOSIS — Z8249 Family history of ischemic heart disease and other diseases of the circulatory system: Secondary | ICD-10-CM | POA: Diagnosis not present

## 2020-03-08 DIAGNOSIS — Z975 Presence of (intrauterine) contraceptive device: Secondary | ICD-10-CM | POA: Diagnosis not present

## 2020-03-08 DIAGNOSIS — T85898A Other specified complication of other internal prosthetic devices, implants and grafts, initial encounter: Principal | ICD-10-CM | POA: Diagnosis present

## 2020-03-08 DIAGNOSIS — F17210 Nicotine dependence, cigarettes, uncomplicated: Secondary | ICD-10-CM | POA: Diagnosis present

## 2020-03-08 DIAGNOSIS — Y828 Other medical devices associated with adverse incidents: Secondary | ICD-10-CM | POA: Diagnosis present

## 2020-03-08 HISTORY — PX: LAPAROSCOPIC GASTRIC BAND REMOVAL WITH LAPAROSCOPIC GASTRIC SLEEVE RESECTION: SHX6498

## 2020-03-08 LAB — CBC
HCT: 43.2 % (ref 36.0–46.0)
Hemoglobin: 13.7 g/dL (ref 12.0–15.0)
MCH: 29.7 pg (ref 26.0–34.0)
MCHC: 31.7 g/dL (ref 30.0–36.0)
MCV: 93.7 fL (ref 80.0–100.0)
Platelets: 237 10*3/uL (ref 150–400)
RBC: 4.61 MIL/uL (ref 3.87–5.11)
RDW: 14.9 % (ref 11.5–15.5)
WBC: 9.6 10*3/uL (ref 4.0–10.5)
nRBC: 0 % (ref 0.0–0.2)

## 2020-03-08 LAB — TYPE AND SCREEN
ABO/RH(D): O POS
Antibody Screen: NEGATIVE

## 2020-03-08 LAB — GLUCOSE, CAPILLARY
Glucose-Capillary: 101 mg/dL — ABNORMAL HIGH (ref 70–99)
Glucose-Capillary: 132 mg/dL — ABNORMAL HIGH (ref 70–99)

## 2020-03-08 LAB — CREATININE, SERUM
Creatinine, Ser: 1.12 mg/dL — ABNORMAL HIGH (ref 0.44–1.00)
GFR calc Af Amer: >60 mL/min (ref >60)
GFR calc non Af Amer: >60 mL/min (ref >60)

## 2020-03-08 LAB — HEMOGLOBIN AND HEMATOCRIT, BLOOD
HCT: 42.6 % (ref 36.0–46.0)
Hemoglobin: 13.8 g/dL (ref 12.0–15.0)

## 2020-03-08 SURGERY — LAPAROSCOPIC GASTRIC BAND REMOVAL WITH LAPAROSCOPIC GASTRIC SLEEVE RESECTION
Anesthesia: General | Site: Abdomen

## 2020-03-08 MED ORDER — MEPERIDINE HCL 50 MG/ML IJ SOLN: 6.2500 mg | INTRAMUSCULAR | Status: DC | PRN

## 2020-03-08 MED ORDER — HYDROMORPHONE HCL 1 MG/ML IJ SOLN
0.2500 mg | INTRAMUSCULAR | Status: DC | PRN
  Administered 2020-03-08: 0.5 mg via INTRAVENOUS

## 2020-03-08 MED ORDER — KCL IN DEXTROSE-NACL 20-5-0.45 MEQ/L-%-% IV SOLN
INTRAVENOUS | Status: DC
  Filled 2020-03-08 (×5): qty 1000

## 2020-03-08 MED ORDER — LIDOCAINE 2% (20 MG/ML) 5 ML SYRINGE
INTRAMUSCULAR | Status: AC
  Filled 2020-03-08: qty 10

## 2020-03-08 MED ORDER — FENTANYL CITRATE (PF) 100 MCG/2ML IJ SOLN
INTRAMUSCULAR | Status: AC
  Filled 2020-03-08: qty 2

## 2020-03-08 MED ORDER — SODIUM CHLORIDE (PF) 0.9 % IJ SOLN
INTRAMUSCULAR | Status: DC | PRN
  Administered 2020-03-08: 10 mL

## 2020-03-08 MED ORDER — LACTATED RINGERS IR SOLN
Status: DC | PRN
  Administered 2020-03-08: 1000 mL

## 2020-03-08 MED ORDER — ONDANSETRON HCL 4 MG/2ML IJ SOLN
INTRAMUSCULAR | Status: DC | PRN
  Administered 2020-03-08 (×2): 4 mg via INTRAVENOUS

## 2020-03-08 MED ORDER — ACETAMINOPHEN 160 MG/5ML PO SOLN
1000.0000 mg | Freq: Three times a day (TID) | ORAL | Status: DC
  Administered 2020-03-08 – 2020-03-10 (×4): 1000 mg via ORAL
  Filled 2020-03-08 (×6): qty 40.6

## 2020-03-08 MED ORDER — ONDANSETRON HCL 4 MG/2ML IJ SOLN
INTRAMUSCULAR | Status: AC
  Filled 2020-03-08: qty 2

## 2020-03-08 MED ORDER — DEXAMETHASONE SODIUM PHOSPHATE 4 MG/ML IJ SOLN
INTRAMUSCULAR | Status: DC | PRN
  Administered 2020-03-08: 10 mg via INTRAVENOUS

## 2020-03-08 MED ORDER — HEPARIN SODIUM (PORCINE) 5000 UNIT/ML IJ SOLN
5000.0000 [IU] | Freq: Three times a day (TID) | INTRAMUSCULAR | Status: DC
  Administered 2020-03-08 – 2020-03-10 (×7): 5000 [IU] via SUBCUTANEOUS
  Filled 2020-03-08 (×7): qty 1

## 2020-03-08 MED ORDER — ACETAMINOPHEN 500 MG PO TABS
1000.0000 mg | ORAL_TABLET | ORAL | Status: AC
  Administered 2020-03-08: 1000 mg via ORAL
  Filled 2020-03-08: qty 2

## 2020-03-08 MED ORDER — CHLORHEXIDINE GLUCONATE CLOTH 2 % EX PADS: 6.0000 | MEDICATED_PAD | Freq: Once | CUTANEOUS | Status: DC

## 2020-03-08 MED ORDER — ROCURONIUM BROMIDE 10 MG/ML (PF) SYRINGE
PREFILLED_SYRINGE | INTRAVENOUS | Status: DC | PRN
  Administered 2020-03-08: 70 mg via INTRAVENOUS
  Administered 2020-03-08 (×2): 30 mg via INTRAVENOUS

## 2020-03-08 MED ORDER — SUGAMMADEX SODIUM 500 MG/5ML IV SOLN
INTRAVENOUS | Status: AC
  Filled 2020-03-08: qty 5

## 2020-03-08 MED ORDER — SODIUM CHLORIDE (PF) 0.9 % IJ SOLN
INTRAMUSCULAR | Status: AC
  Filled 2020-03-08: qty 10

## 2020-03-08 MED ORDER — PROMETHAZINE HCL 25 MG/ML IJ SOLN
12.5000 mg | Freq: Once | INTRAMUSCULAR | Status: AC | PRN
  Administered 2020-03-08: 12.5 mg via INTRAVENOUS
  Filled 2020-03-08: qty 1

## 2020-03-08 MED ORDER — HEPARIN SODIUM (PORCINE) 5000 UNIT/ML IJ SOLN
5000.0000 [IU] | INTRAMUSCULAR | Status: AC
  Administered 2020-03-08: 5000 [IU] via SUBCUTANEOUS
  Filled 2020-03-08: qty 1

## 2020-03-08 MED ORDER — LIDOCAINE 2% (20 MG/ML) 5 ML SYRINGE
INTRAMUSCULAR | Status: AC
  Filled 2020-03-08: qty 5

## 2020-03-08 MED ORDER — ONDANSETRON HCL 4 MG/2ML IJ SOLN
4.0000 mg | INTRAMUSCULAR | Status: DC | PRN
  Administered 2020-03-08: 4 mg via INTRAVENOUS
  Filled 2020-03-08: qty 2

## 2020-03-08 MED ORDER — MIDAZOLAM HCL 2 MG/2ML IJ SOLN
INTRAMUSCULAR | Status: AC
  Filled 2020-03-08: qty 2

## 2020-03-08 MED ORDER — KETOROLAC TROMETHAMINE 30 MG/ML IJ SOLN: 30.0000 mg | Freq: Once | INTRAMUSCULAR | Status: DC | PRN

## 2020-03-08 MED ORDER — ROCURONIUM BROMIDE 10 MG/ML (PF) SYRINGE
PREFILLED_SYRINGE | INTRAVENOUS | Status: AC
  Filled 2020-03-08: qty 10

## 2020-03-08 MED ORDER — PROPOFOL 10 MG/ML IV BOLUS
INTRAVENOUS | Status: DC | PRN
  Administered 2020-03-08: 200 mg via INTRAVENOUS

## 2020-03-08 MED ORDER — BUPIVACAINE LIPOSOME 1.3 % IJ SUSP
INTRAMUSCULAR | Status: DC | PRN
  Administered 2020-03-08: 20 mL

## 2020-03-08 MED ORDER — ONDANSETRON HCL 4 MG/2ML IJ SOLN
4.0000 mg | INTRAMUSCULAR | Status: DC | PRN
  Administered 2020-03-08: 8 mg via INTRAVENOUS
  Administered 2020-03-09 (×5): 4 mg via INTRAVENOUS
  Administered 2020-03-10: 8 mg via INTRAVENOUS
  Filled 2020-03-08: qty 4
  Filled 2020-03-08 (×4): qty 2
  Filled 2020-03-08 (×2): qty 4
  Filled 2020-03-08 (×2): qty 2

## 2020-03-08 MED ORDER — METOPROLOL TARTRATE 5 MG/5ML IV SOLN
5.0000 mg | Freq: Four times a day (QID) | INTRAVENOUS | Status: DC | PRN
  Administered 2020-03-08 – 2020-03-09 (×2): 5 mg via INTRAVENOUS
  Filled 2020-03-08 (×2): qty 5

## 2020-03-08 MED ORDER — SUGAMMADEX SODIUM 500 MG/5ML IV SOLN
INTRAVENOUS | Status: DC | PRN
  Administered 2020-03-08: 350 mg via INTRAVENOUS

## 2020-03-08 MED ORDER — LIDOCAINE 2% (20 MG/ML) 5 ML SYRINGE
INTRAMUSCULAR | Status: DC | PRN
  Administered 2020-03-08: 1.5 mg/kg/h via INTRAVENOUS

## 2020-03-08 MED ORDER — PROPOFOL 10 MG/ML IV BOLUS
INTRAVENOUS | Status: AC
  Filled 2020-03-08: qty 40

## 2020-03-08 MED ORDER — MIDAZOLAM HCL 5 MG/5ML IJ SOLN
INTRAMUSCULAR | Status: DC | PRN
  Administered 2020-03-08: 2 mg via INTRAVENOUS

## 2020-03-08 MED ORDER — DEXAMETHASONE SODIUM PHOSPHATE 10 MG/ML IJ SOLN
INTRAMUSCULAR | Status: AC
  Filled 2020-03-08: qty 1

## 2020-03-08 MED ORDER — APREPITANT 40 MG PO CAPS
40.0000 mg | ORAL_CAPSULE | ORAL | Status: AC
  Administered 2020-03-08: 40 mg via ORAL
  Filled 2020-03-08: qty 1

## 2020-03-08 MED ORDER — FENTANYL CITRATE (PF) 250 MCG/5ML IJ SOLN
INTRAMUSCULAR | Status: DC | PRN
  Administered 2020-03-08 (×3): 50 ug via INTRAVENOUS
  Administered 2020-03-08: 100 ug via INTRAVENOUS
  Administered 2020-03-08: 50 ug via INTRAVENOUS

## 2020-03-08 MED ORDER — KETAMINE HCL 10 MG/ML IJ SOLN
INTRAMUSCULAR | Status: AC
  Filled 2020-03-08: qty 1

## 2020-03-08 MED ORDER — PANTOPRAZOLE SODIUM 40 MG IV SOLR
40.0000 mg | Freq: Every day | INTRAVENOUS | Status: DC
  Administered 2020-03-08 – 2020-03-09 (×2): 40 mg via INTRAVENOUS
  Filled 2020-03-08 (×2): qty 40

## 2020-03-08 MED ORDER — ACETAMINOPHEN 500 MG PO TABS
1000.0000 mg | ORAL_TABLET | Freq: Three times a day (TID) | ORAL | Status: DC
  Filled 2020-03-08 (×2): qty 2

## 2020-03-08 MED ORDER — MORPHINE SULFATE (PF) 2 MG/ML IV SOLN: 1.0000 mg | INTRAVENOUS | Status: DC | PRN

## 2020-03-08 MED ORDER — OXYCODONE HCL 5 MG/5ML PO SOLN
5.0000 mg | Freq: Four times a day (QID) | ORAL | Status: DC | PRN
  Administered 2020-03-09: 5 mg via ORAL
  Filled 2020-03-08: qty 5

## 2020-03-08 MED ORDER — PROMETHAZINE HCL 25 MG/ML IJ SOLN
INTRAMUSCULAR | Status: AC
  Filled 2020-03-08: qty 1

## 2020-03-08 MED ORDER — SODIUM CHLORIDE 0.9 % IV SOLN
2.0000 g | INTRAVENOUS | Status: AC
  Administered 2020-03-08: 2 g via INTRAVENOUS
  Filled 2020-03-08: qty 2

## 2020-03-08 MED ORDER — LACTATED RINGERS IV SOLN: INTRAVENOUS | Status: DC

## 2020-03-08 MED ORDER — HYDROMORPHONE HCL 1 MG/ML IJ SOLN
INTRAMUSCULAR | Status: AC
  Filled 2020-03-08: qty 1

## 2020-03-08 MED ORDER — SCOPOLAMINE 1 MG/3DAYS TD PT72
1.0000 | MEDICATED_PATCH | TRANSDERMAL | Status: DC
  Administered 2020-03-08: 1.5 mg via TRANSDERMAL
  Filled 2020-03-08: qty 1

## 2020-03-08 MED ORDER — PROMETHAZINE HCL 25 MG/ML IJ SOLN
6.2500 mg | INTRAMUSCULAR | Status: DC | PRN
  Administered 2020-03-08: 12.5 mg via INTRAVENOUS

## 2020-03-08 MED ORDER — ENSURE MAX PROTEIN PO LIQD
2.0000 [oz_av] | ORAL | Status: DC
  Administered 2020-03-09 – 2020-03-10 (×5): 2 [oz_av] via ORAL

## 2020-03-08 MED ORDER — KETAMINE HCL 10 MG/ML IJ SOLN
INTRAMUSCULAR | Status: DC | PRN
  Administered 2020-03-08: 15 mg via INTRAVENOUS
  Administered 2020-03-08: 35 mg via INTRAVENOUS

## 2020-03-08 SURGICAL SUPPLY — 66 items
APPLICATOR COTTON TIP 6 STRL (MISCELLANEOUS) IMPLANT
APPLICATOR COTTON TIP 6IN STRL (MISCELLANEOUS)
APPLIER CLIP 5 13 M/L LIGAMAX5 (MISCELLANEOUS)
APPLIER CLIP ROT 10 11.4 M/L (STAPLE)
APPLIER CLIP ROT 13.4 12 LRG (CLIP)
BLADE SURG 15 STRL LF DISP TIS (BLADE) ×1 IMPLANT
BLADE SURG 15 STRL SS (BLADE) ×1
CABLE HIGH FREQUENCY MONO STRZ (ELECTRODE) IMPLANT
CLIP APPLIE 5 13 M/L LIGAMAX5 (MISCELLANEOUS) IMPLANT
CLIP APPLIE ROT 10 11.4 M/L (STAPLE) IMPLANT
CLIP APPLIE ROT 13.4 12 LRG (CLIP) IMPLANT
COVER WAND RF STERILE (DRAPES) IMPLANT
DERMABOND ADVANCED (GAUZE/BANDAGES/DRESSINGS) ×1
DERMABOND ADVANCED .7 DNX12 (GAUZE/BANDAGES/DRESSINGS) ×1 IMPLANT
DEVICE SUT QUICK LOAD TK 5 (STAPLE) IMPLANT
DEVICE SUT TI-KNOT TK 5X26 (MISCELLANEOUS) IMPLANT
DEVICE SUTURE ENDOST 10MM (ENDOMECHANICALS) IMPLANT
DEVICE TROCAR PUNCTURE CLOSURE (ENDOMECHANICALS) ×2 IMPLANT
DISSECTOR BLUNT TIP ENDO 5MM (MISCELLANEOUS) IMPLANT
ELECT L-HOOK LAP 45CM DISP (ELECTROSURGICAL)
ELECT PENCIL ROCKER SW 15FT (MISCELLANEOUS) ×2 IMPLANT
ELECT REM PT RETURN 15FT ADLT (MISCELLANEOUS) ×2 IMPLANT
ELECTRODE L-HOOK LAP 45CM DISP (ELECTROSURGICAL) IMPLANT
GAUZE SPONGE 4X4 12PLY STRL (GAUZE/BANDAGES/DRESSINGS) IMPLANT
GLOVE BIOGEL M 8.0 STRL (GLOVE) ×2 IMPLANT
GOWN STRL REUS W/TWL XL LVL3 (GOWN DISPOSABLE) ×4 IMPLANT
GRASPER SUT TROCAR 14GX15 (MISCELLANEOUS) ×2 IMPLANT
HANDLE STAPLE EGIA 4 XL (STAPLE) ×2 IMPLANT
HEMOSTAT SURGICEL 4X8 (HEMOSTASIS) ×2 IMPLANT
HOVERMATT SINGLE USE (MISCELLANEOUS) ×2 IMPLANT
KIT BASIN OR (CUSTOM PROCEDURE TRAY) ×2 IMPLANT
KIT TURNOVER KIT A (KITS) IMPLANT
MARKER SKIN DUAL TIP RULER LAB (MISCELLANEOUS) IMPLANT
NEEDLE SPNL 22GX3.5 QUINCKE BK (NEEDLE) ×2 IMPLANT
PACK UNIVERSAL I (CUSTOM PROCEDURE TRAY) ×2 IMPLANT
RELOAD TRI 45 ART MED THCK BLK (STAPLE) ×4 IMPLANT
RELOAD TRI 45 ART MED THCK PUR (STAPLE) IMPLANT
RELOAD TRI 60 ART MED THCK BLK (STAPLE) ×6 IMPLANT
RELOAD TRI 60 ART MED THCK PUR (STAPLE) ×2 IMPLANT
SCISSORS LAP 5X45 EPIX DISP (ENDOMECHANICALS) ×2 IMPLANT
SET IRRIG TUBING LAPAROSCOPIC (IRRIGATION / IRRIGATOR) ×2 IMPLANT
SET TUBE SMOKE EVAC HIGH FLOW (TUBING) ×2 IMPLANT
SHEARS HARMONIC ACE PLUS 45CM (MISCELLANEOUS) ×2 IMPLANT
SLEEVE ADV FIXATION 5X100MM (TROCAR) ×4 IMPLANT
SLEEVE GASTRECTOMY 36FR VISIGI (MISCELLANEOUS) ×2 IMPLANT
SOL ANTI FOG 6CC (MISCELLANEOUS) ×1 IMPLANT
SOLUTION ANTI FOG 6CC (MISCELLANEOUS) ×1
SPONGE LAP 18X18 RF (DISPOSABLE) ×2 IMPLANT
STAPLER VISISTAT 35W (STAPLE) IMPLANT
SUT MNCRL AB 4-0 PS2 18 (SUTURE) ×4 IMPLANT
SUT SURGIDAC NAB ES-9 0 48 120 (SUTURE) IMPLANT
SUT VIC AB 2-0 SH 27 (SUTURE) ×1
SUT VIC AB 2-0 SH 27X BRD (SUTURE) ×1 IMPLANT
SUT VICRYL 0 TIES 12 18 (SUTURE) ×2 IMPLANT
SYR 10ML ECCENTRIC (SYRINGE) ×2 IMPLANT
SYR 20ML LL LF (SYRINGE) ×2 IMPLANT
SYR 50ML LL SCALE MARK (SYRINGE) ×2 IMPLANT
TOWEL OR 17X26 10 PK STRL BLUE (TOWEL DISPOSABLE) ×2 IMPLANT
TOWEL OR NON WOVEN STRL DISP B (DISPOSABLE) ×2 IMPLANT
TRAY FOLEY MTR SLVR 16FR STAT (SET/KITS/TRAYS/PACK) IMPLANT
TROCAR ADV FIXATION 5X100MM (TROCAR) ×2 IMPLANT
TROCAR BLADELESS 15MM (ENDOMECHANICALS) ×2 IMPLANT
TROCAR BLADELESS OPT 5 100 (ENDOMECHANICALS) ×2 IMPLANT
TUBE CALIBRATION LAPBAND (TUBING) IMPLANT
TUBING CONNECTING 10 (TUBING) ×4 IMPLANT
TUBING ENDO SMARTCAP (MISCELLANEOUS) ×2 IMPLANT

## 2020-03-08 NOTE — Interval H&P Note (Signed)
History and Physical Interval Note:  03/08/2020 7:27 AM  Julia Davis  has presented today for surgery, with the diagnosis of Lap Band Malfunction/Obstruction, DM II.  The various methods of treatment have been discussed with the patient and family. After consideration of risks, benefits and other options for treatment, the patient has consented to  Procedure(s): LAPAROSCOPIC GASTRIC BAND REMOVAL WITH LAPAROSCOPIC GASTRIC SLEEVE RESECTION, Upper Endo, ERAS Pathway (N/A) as a surgical intervention.  The patient's history has been reviewed, patient examined, no change in status, stable for surgery.  I have reviewed the patient's chart and labs.  Questions were answered to the patient's satisfaction.     [name]

## 2020-03-08 NOTE — Progress Notes (Signed)
NUTRITION NOTE  Consult received for Bariatric Diet education per DROP protocol. All post-op/pre-discharge information, including diet-related information, to be reviewed with the patient by Bariatric Nurse Educator.   If additional nutrition-related needs arise, please re-consult RD as needed.    Trenton Gammon, MS, RD, LDN, CNSC Inpatient Clinical Dietitian RD pager # available in [facility]  After hours/weekend pager # available in [facility]

## 2020-03-08 NOTE — Progress Notes (Signed)
MD paged regarding patients BP 162/114. RN ordered to give lopressor 5mg  from prn list and add parameters to give medication for DBP >100.

## 2020-03-08 NOTE — Anesthesia Postprocedure Evaluation (Signed)
Anesthesia Post Note  Patient: Julia Davis  Procedure(s) Performed: LAPAROSCOPIC GASTRIC BAND REMOVAL WITH LAPAROSCOPIC GASTRIC SLEEVE RESECTION, Upper Endo, ERAS Pathway (N/A Abdomen)     Anesthesia Post Evaluation  Last Vitals:  Vitals:   03/08/20 1331 03/08/20 1441  BP: (!) 162/106 (!) 162/114  Pulse: 63 63  Resp: 16 17  Temp: 36.9 C 37.1 C  SpO2: 99% 100%    Last Pain:  Vitals:   03/08/20 1441  TempSrc: Oral  PainSc:    Pain Goal:                   Caren Macadam

## 2020-03-08 NOTE — Progress Notes (Signed)
PHARMACY CONSULT FOR:  Risk Assessment for Post-Discharge VTE Following Bariatric Surgery  Post-Discharge VTE Risk Assessment: This patient's probability of 30-day post-discharge VTE is increased due to the factors marked:   Female    Age >/=60 years    BMI >/=50 kg/m2    CHF    Dyspnea at Rest    Paraplegia  x  Non-gastric-band surgery    Operation Time >/=3 hr    Return to OR     Length of Stay >/= 3 d      Hx of VTE   Hypercoagulable condition   Significant venous stasis   Predicted probability of 30-day post-discharge VTE: 0.16%  Other patient-specific factors to consider:   Recommendation for Discharge: No pharmacologic prophylaxis post-discharge  Julia Davis is a 34 y.o. female who underwent  Removal of gastric band and conversion to laparoscopic sleeve gastrectomy on  03/08/20   Case start: 0807 Case end: 1017   No Known Allergies  Patient Measurements: Height: 5\' 9"  (175.3 cm) Weight: (!) 306 lb 12.8 oz (139.2 kg) IBW/kg (Calculated) : 66.2 Body mass index is 45.31 kg/m.  Recent Labs    03/08/20 1105  WBC 9.6  HGB 13.7  HCT 43.2  PLT 237  CREATININE 1.12*   Estimated Creatinine Clearance: 105.6 mL/min (A) (by C-G formula based on SCr of 1.12 mg/dL (H)).    Past Medical History:  Diagnosis Date  . Abnormal Pap smear of cervix    age 84-neg colposcopy--no treatment  . Arthritis of knee, left   . Asthma    mild, seasonal  . Diabetes mellitus without complication (HCC)    gestational only  . Dysmenorrhea   . History of hiatal hernia    history of  . History of iron deficiency anemia   . Low vitamin D level 2020  . OSA on CPAP    history of, lost weight  . STD (sexually transmitted disease)    Hx HSV II  . Trichomonas infection 2020     Medications Prior to Admission  Medication Sig Dispense Refill Last Dose  . ibuprofen (ADVIL) 200 MG tablet Take 600 mg by mouth every 8 (eight) hours as needed (for pain.).   More than a month at  Unknown time  . levonorgestrel (MIRENA) 20 MCG/24HR IUD 1 each by Intrauterine route once.     . TURMERIC PO Take 1 capsule by mouth daily.   More than a month at Unknown time       2021 03/08/2020,1:25 PM

## 2020-03-08 NOTE — Progress Notes (Signed)
Patient has started water and demonstrated IS use. Pain is controlled. Patient has complaints of nausea which PRN zofran was given.

## 2020-03-08 NOTE — Anesthesia Postprocedure Evaluation (Signed)
Anesthesia Post Note  Patient: Julia Davis  Procedure(s) Performed: LAPAROSCOPIC GASTRIC BAND REMOVAL WITH LAPAROSCOPIC GASTRIC SLEEVE RESECTION, Upper Endo, ERAS Pathway (N/A Abdomen)     Patient location during evaluation: PACU Anesthesia Type: General Level of consciousness: awake and sedated Pain management: pain level controlled Vital Signs Assessment: post-procedure vital signs reviewed and stable Respiratory status: spontaneous breathing Cardiovascular status: stable Postop Assessment: no apparent nausea or vomiting Anesthetic complications: no    Last Vitals:  Vitals:   03/08/20 1331 03/08/20 1441  BP: (!) 162/106 (!) 162/114  Pulse: 63 63  Resp: 16 17  Temp: 36.9 C 37.1 C  SpO2: 99% 100%    Last Pain:  Vitals:   03/08/20 1441  TempSrc: Oral  PainSc:    Pain Goal:                   Caren Macadam

## 2020-03-08 NOTE — Anesthesia Procedure Notes (Signed)
Procedure Name: Intubation Date/Time: 03/08/2020 7:37 AM Performed by: [place] Torrez Renfroe C, CRNA Pre-anesthesia Checklist: Patient identified, Emergency Drugs available, Suction available and Patient being monitored Patient Re-evaluated:Patient Re-evaluated prior to induction Oxygen Delivery Method: Circle system utilized Preoxygenation: Pre-oxygenation with 100% oxygen Induction Type: IV induction Ventilation: Mask ventilation without difficulty Laryngoscope Size: Mac and 3 Grade View: Grade I Tube type: Oral Tube size: 7.0 mm Number of attempts: 1 Airway Equipment and Method: Stylet and Oral airway Placement Confirmation: ETT inserted through vocal cords under direct vision,  positive ETCO2 and breath sounds checked- equal and bilateral Secured at: 21 cm Tube secured with: Tape Dental Injury: Teeth and Oropharynx as per pre-operative assessment

## 2020-03-08 NOTE — Op Note (Signed)
08 March 2020  Surgeon: Wenda Low, MD, FACS  Asst:  Ovidio Kin, MD, FACS  Anes:  General endotracheal  Procedure: Laparoscopic removal of APL lapband with  sleeve gastrectomy and upper endoscopy  Diagnosis: Morbid obesity and positional lapband obstruction  Complications: None noted  EBL:   20 cc  Description of Procedure:  The patient was take to OR 1 and given general anesthesia.  The abdomen was prepped with chloroprep and draped sterilely.  A timeout was performed.  Access to the abdomen was achieved with a 5 mm Optiview through the left upper quadrant.  Standard trocar placement including a 1.5 port to the right of midline. Exparel tap block was performed.  The Satira Mccallum was placed and the adhesions to the liver were taken down.  The band was unbuckled and was removed after cutting the tube.  The plications were taken down and some very dense adhesions were taken down from the spleen.     The ViSiGi 36Fr tube was inserted to deflate the stomach and was pulled back into the esophagus.    The pylorus was identified and we measured 4 cm back and marked the antrum.  At that point we began dissection to take down the greater curvature of the stomach using the Harmonic scalpel.  This dissection was taken all the way up to the left crus.  Posterior attachments of the stomach were also taken down.    The ViSiGi tube was then passed into the antrum and suction applied so that it was snug along the lessor curvature.  The "crow's foot" or incisura was identified.  The sleeve gastrectomy was begun using the [employer] stapler beginning with a 4.5 black with TRS followed by a 6 cm black with trs and then a purple with trs and then black load with trs all the way up to the top.  When the sleeve was complete the tube was taken off suction and insufflated briefly.  The tube was withdrawn.  Upper endoscopy was then performed by Dr. Ezzard Standing.  . There is a infraumbilcal small hernia that will  be amenable to proper repair when she loses weight.  For now it is surrounded by fat.      The specimen was extracted through the 15 trocar site.  Local was provided by infiltrating with the TAP block and closed 4-0 Monocryl and Dermabond.    Matt B. Daphine Deutscher, MD, [facility], [place] [phone]

## 2020-03-08 NOTE — Discharge Instructions (Signed)
° ° ° °GASTRIC BYPASS/SLEEVE ° Home Care Instructions ° ° These instructions are to help you care for yourself when you go home. ° °Call: If you have any problems. °• Call [phone] and ask for the surgeon on call °• If you need immediate help, come to the ER at [place] [facility].  °• Tell the ER staff that you are a new post-op gastric bypass or gastric sleeve patient °  °Signs and symptoms to report: • Severe vomiting or nausea °o If you cannot keep down clear liquids for longer than 1 day, call your surgeon  °• Abdominal pain that does not get better after taking your pain medication °• Fever over 100.4° F with chills °• Heart beating over 100 beats a minute °• Shortness of breath at rest °• Chest pain °•  Redness, swelling, drainage, or foul odor at incision (surgical) sites °•  If your incisions open or pull apart °• Swelling or pain in calf (lower leg) °• Diarrhea (Loose bowel movements that happen often), frequent watery, uncontrolled bowel movements °• Constipation, (no bowel movements for 3 days) if this happens: Pick one °o Milk of Magnesia, 2 tablespoons by mouth, 3 times a day for 2 days if needed °o Stop taking Milk of Magnesia once you have a bowel movement °o Call your doctor if constipation continues °Or °o Miralax  (instead of Milk of Magnesia) following the label instructions °o Stop taking Miralax once you have a bowel movement °o Call your doctor if constipation continues °• Anything you think is not normal °  °Normal side effects after surgery: • Unable to sleep at night or unable to focus °• Irritability or moody °• Being tearful (crying) or depressed °These are common complaints, possibly related to your anesthesia medications that put you to sleep, stress of surgery, and change in lifestyle.  This usually goes away a few weeks after surgery.  If these feelings continue, call your primary care doctor. °  °Wound Care: You may have surgical glue, steri-strips, or staples over your incisions after  surgery °• Surgical glue:  Looks like a clear film over your incisions and will wear off a little at a time °• Steri-strips: Strips of tape over your incisions. You may notice a yellowish color on the skin under the steri-strips. This is used to make the   steri-strips stick better. Do not pull the steri-strips off - let them fall off °• Staples: Staples may be removed before you leave the hospital °o If you go home with staples, call [facility], [phone] at for an appointment with your surgeon’s nurse to have staples removed 10 days after surgery. °• Showering: You may shower two (2) days after your surgery unless your surgeon tells you differently °o Wash gently around incisions with warm soapy water, rinse well, and gently pat dry  °o No tub baths until staples are removed, steri-strips fall off or glue is gone.  °  °Medications: • Medications should be liquid or crushed if larger than the size of a dime °• Extended release pills (medication that release a little bit at a time through the day) should NOT be crushed or cut. (examples include XL, ER, DR, SR) °• Depending on the size and number of medications you take, you may need to space (take a few throughout the day)/change the time you take your medications so that you do not over-fill your pouch (smaller stomach) °• Make sure you follow-up with your primary care doctor to   make medication changes needed during rapid weight loss and life-style changes °• If you have diabetes, follow up with the doctor that orders your diabetes medication(s) within one week after surgery and check your blood sugar regularly. °• Do not drive while taking prescription pain medication  °• It is ok to take Tylenol by the bottle instructions with your pain medicine or instead of your pain medicine as needed.  DO NOT TAKE NSAIDS (EXAMPLES OF NSAIDS:  IBUPROFREN/ NAPROXEN)  °Diet:                    First 2 Weeks ° You will see the dietician t about two (2) weeks  after your surgery. The dietician will increase the types of foods you can eat if you are handling liquids well: °• If you have severe vomiting or nausea and cannot keep down clear liquids lasting longer than 1 day, call your surgeon @ ([phone]) °Protein Shake °• Drink at least 2 ounces of shake 5-6 times per day °• Each serving of protein shakes (usually 8 - 12 ounces) should have: °o 15 grams of protein  °o And no more than 5 grams of carbohydrate  °• Goal for protein each day: °o Men = 80 grams per day °o Women = 60 grams per day °• Protein powder may be added to fluids such as non-fat milk or Lactaid milk or unsweetened Soy/Almond milk (limit to 35 grams added protein powder per serving) ° °Hydration °• Slowly increase the amount of water and other clear liquids as tolerated (See Acceptable Fluids) °• Slowly increase the amount of protein shake as tolerated  °•  Sip fluids slowly and throughout the day.  Do not use straws. °• May use sugar substitutes in small amounts (no more than 6 - 8 packets per day; i.e. Splenda) ° °Fluid Goal °• The first goal is to drink at least 8 ounces of protein shake/drink per day (or as directed by the nutritionist); some examples of protein shakes are Syntrax Nectar, Adkins Advantage, EAS Edge HP, and Unjury. See handout from pre-op Bariatric Education Class: °o Slowly increase the amount of protein shake you drink as tolerated °o You may find it easier to slowly sip shakes throughout the day °o It is important to get your proteins in first °• Your fluid goal is to drink 64 - 100 ounces of fluid daily °o It may take a few weeks to build up to this °• 32 oz (or more) should be clear liquids  °And  °• 32 oz (or more) should be full liquids (see below for examples) °• Liquids should not contain sugar, caffeine, or carbonation ° °Clear Liquids: °• Water or Sugar-free flavored water (i.e. Fruit H2O, Propel) °• Decaffeinated coffee or tea (sugar-free) °• Crystal Lite, Wyler’s Lite,  Minute Maid Lite °• Sugar-free Jell-O °• Bouillon or broth °• Sugar-free Popsicle:   *Less than 20 calories each; Limit 1 per day ° °Full Liquids: °Protein Shakes/Drinks + 2 choices per day of other full liquids °• Full liquids must be: °o No More Than 15 grams of Carbs per serving  °o No More Than 3 grams of Fat per serving °• Strained low-fat cream soup (except Cream of Potato or Tomato) °• Non-Fat milk °• Fat-free Lactaid Milk °• Unsweetened Soy Or Unsweetened Almond Milk °• Low Sugar yogurt (Dannon Lite & Fit, Greek yogurt; Oikos Triple Zero; Chobani Simply 100; Yoplait 100 calorie Greek - No Fruit on the Bottom) ° °  °Vitamins   and Minerals • Start 1 day after surgery unless otherwise directed by your surgeon °• 2 Chewable Bariatric Specific Multivitamin / Multimineral Supplement with iron (Example: Bariatric Advantage Multi EA) °• Chewable Calcium with Vitamin D-3 °(Example: 3 Chewable Calcium Plus 600 with Vitamin D-3) °o Take 500 mg three (3) times a day for a total of 1500 mg each day °o Do not take all 3 doses of calcium at one time as it may cause constipation, and you can only absorb 500 mg  at a time  °o Do not mix multivitamins containing iron with calcium supplements; take 2 hours apart °• Menstruating women and those with a history of anemia (a blood disease that causes weakness) may need extra iron °o Talk with your doctor to see if you need more iron °• Do not stop taking or change any vitamins or minerals until you talk to your dietitian or surgeon °• Your Dietitian and/or surgeon must approve all vitamin and mineral supplements °  °Activity and Exercise: Limit your physical activity as instructed by your doctor.  It is important to continue walking at home.  During this time, use these guidelines: °• Do not lift anything greater than ten (10) pounds for at least two (2) weeks °• Do not go back to work or drive until your surgeon says you can °• You may have sex when you feel comfortable  °o It is  VERY important for female patients to use a reliable birth control method; fertility often increases after surgery  °o All hormonal birth control will be ineffective for 30 days after surgery due to medications given during surgery a barrier method must be used. °o Do not get pregnant for at least 18 months °• Start exercising as soon as your doctor tells you that you can °o Make sure your doctor approves any physical activity °• Start with a simple walking program °• Walk 5-15 minutes each day, 7 days per week.  °• Slowly increase until you are walking 30-45 minutes per day °Consider joining our BELT program. [phone] or email belt@[employer] °  °Special Instructions Things to remember: °• Use your CPAP when sleeping if this applies to you ° °• [facility] has two free Bariatric Surgery Support Groups that meet monthly °o The 3rd Thursday of each month, 6 pm, [facility]  °o The 2nd Friday of each month, 11:45 am in the private dining room in the basement of [place] °• It is very important to keep all follow up appointments with your surgeon, dietitian, primary care physician, and behavioral health practitioner °• Routine follow up schedule with your surgeon include appointments at 2-3 weeks, 6-8 weeks, 6 months, and 1 year at a minimum.  Your surgeon may request to see you more often.   °o After the first year, please follow up with your bariatric surgeon and dietitian at least once a year in order to maintain best weight loss results °[facility]: [phone] °[facility]: [phone] °Bariatric Nurse Coordinator: [phone] °  °   Reviewed and Endorsed  °by [place] Patient Education Committee, June, 2016 °Edits Approved: Aug, 2018 ° ° ° °

## 2020-03-08 NOTE — Op Note (Signed)
Name:  [name] MRN: [id number] Date of Surgery: 03/08/2020  Preop Diagnosis:  Morbid Obesity  Postop Diagnosis:  Morbid Obesity (Weight - 139 kg, BMI - 45.3), S/P Gastric Sleeve resection  Procedure:  Upper endoscopy  (Intraoperative)  Surgeon:  Ovidio Kin, M.D.  Anesthesia:  GET  Indications for procedure: Julia Davis is a 34 y.o. female whose primary care physician is [facility] [place] and has completed a gastric sleeve resection today for weight loss by Dr. Daphine Deutscher.  I am doing an intraoperative upper endoscopy to evaluate the gastric pouch after the sleeve gastrectomy.  Operative Note: The patient is under general anesthesia.  Dr. Daphine Deutscher is laparoscoping the patient while I do an upper endoscopy to evaluate the stomach pouch.  With the patient intubated, I passed the Olympus upper endoscope without difficulty down the esophagus.  The esophagus was unremarkable.  The esophago-gastric junction was at 38 cm.    The mucosa of the stomach looked viable and the staple line was intact without bleeding.  I advanced the scope to the pylorus, but did not go through it.  While I insufflated the stomach pouch with air, Dr. Daphine Deutscher  flooded the upper abdomen with saline to put the gastric pouch under saline.  There was no bubbling or evidence of a leak.  There was no evidence of narrowing of the pouch and the gastric sleeve looked tubular.  The scope was then withdrawn.  The esophagus was unremarkable and the patient tolerated the endoscopy without difficulty.  Ovidio Kin, MD, [facility] Office phone:  [phone]

## 2020-03-08 NOTE — Progress Notes (Signed)
Discussed post op day goals with patient including ambulation, IS, diet progression, pain, and nausea control.  BSTOP education provided including BSTOP information guide, "Guide for Pain Management after your Bariatric Procedure".  Questions answered. 

## 2020-03-08 NOTE — Transfer of Care (Signed)
Immediate Anesthesia Transfer of Care Note  Patient: Julia Davis  Procedure(s) Performed: LAPAROSCOPIC GASTRIC BAND REMOVAL WITH LAPAROSCOPIC GASTRIC SLEEVE RESECTION, Upper Endo, ERAS Pathway (N/A Abdomen)  Patient Location: PACU  Anesthesia Type:General  Level of Consciousness: awake, alert  and oriented  Airway & Oxygen Therapy: Patient Spontanous Breathing and Patient connected to face mask oxygen  Post-op Assessment: Report given to RN and Post -op Vital signs reviewed and stable  Post vital signs: Reviewed and stable  Last Vitals:  Vitals Value Taken Time  BP 193/112 03/08/20 1028  Temp    Pulse 81 03/08/20 1030  Resp 17 03/08/20 1030  SpO2 100 % 03/08/20 1030  Vitals shown include unvalidated device data.  Last Pain:  Vitals:   03/08/20 0550  TempSrc: Oral         Complications: No apparent anesthesia complications

## 2020-03-09 LAB — CBC WITH DIFFERENTIAL/PLATELET
Abs Immature Granulocytes: 0.06 10*3/uL (ref 0.00–0.07)
Basophils Absolute: 0 10*3/uL (ref 0.0–0.1)
Basophils Relative: 0 %
Eosinophils Absolute: 0.4 10*3/uL (ref 0.0–0.5)
Eosinophils Relative: 3 %
HCT: 40.5 % (ref 36.0–46.0)
Hemoglobin: 12.9 g/dL (ref 12.0–15.0)
Immature Granulocytes: 1 %
Lymphocytes Relative: 6 %
Lymphs Abs: 0.7 10*3/uL (ref 0.7–4.0)
MCH: 29.9 pg (ref 26.0–34.0)
MCHC: 31.9 g/dL (ref 30.0–36.0)
MCV: 93.8 fL (ref 80.0–100.0)
Monocytes Absolute: 1.1 10*3/uL — ABNORMAL HIGH (ref 0.1–1.0)
Monocytes Relative: 9 %
Neutro Abs: 10.4 10*3/uL — ABNORMAL HIGH (ref 1.7–7.7)
Neutrophils Relative %: 81 %
Platelets: 227 10*3/uL (ref 150–400)
RBC: 4.32 MIL/uL (ref 3.87–5.11)
RDW: 14.8 % (ref 11.5–15.5)
WBC: 12.7 10*3/uL — ABNORMAL HIGH (ref 4.0–10.5)
nRBC: 0 % (ref 0.0–0.2)

## 2020-03-09 LAB — GLUCOSE, CAPILLARY: Glucose-Capillary: 149 mg/dL — ABNORMAL HIGH (ref 70–99)

## 2020-03-09 MED ORDER — PROMETHAZINE HCL 25 MG/ML IJ SOLN
12.5000 mg | Freq: Once | INTRAMUSCULAR | Status: AC
  Administered 2020-03-09: 12.5 mg via INTRAVENOUS
  Filled 2020-03-09: qty 1

## 2020-03-09 MED ORDER — AMLODIPINE BESYLATE 5 MG PO TABS
5.0000 mg | ORAL_TABLET | Freq: Every day | ORAL | Status: DC
  Administered 2020-03-09 – 2020-03-10 (×2): 5 mg via ORAL
  Filled 2020-03-09 (×3): qty 1

## 2020-03-09 NOTE — Progress Notes (Signed)
Patient alert and oriented, Post op day 1.  Provided support and encouragement.  Encouraged pulmonary toilet, ambulation and small sips of liquids. Continues to work on meeting bari clear fluid goal.  Nauseated this morning, received IV Zofran. All questions answered.  Will continue to monitor.

## 2020-03-09 NOTE — Progress Notes (Signed)
RN Navreet established PIV access, IV team not needed at this time

## 2020-03-09 NOTE — Progress Notes (Signed)
Bedside RN alerted [facility] of elevated BP continued after dose of IV Lopressor.  (156/112) patient with no complaints of pain, nausea relieved, sleeping off and on.  Discussed with Dr Daphine Deutscher, orders received.  Norvasc to start today and begin daily.  Bedside RN aware of plan

## 2020-03-09 NOTE — Progress Notes (Signed)
Patient with continued nausea through morning.  IV Phenergan given with some relief.  Continuing to work on water did not want any additional bari clear fluid.  Encouraged to walk, patient stated tired from medication. BP elevated, RN giving prn blood pressure medication.  Questions answered.  Will continue to monitor.

## 2020-03-10 LAB — SURGICAL PATHOLOGY

## 2020-03-10 LAB — CBC WITH DIFFERENTIAL/PLATELET
Abs Immature Granulocytes: 0.07 10*3/uL (ref 0.00–0.07)
Basophils Absolute: 0 10*3/uL (ref 0.0–0.1)
Basophils Relative: 0 %
Eosinophils Absolute: 0.2 10*3/uL (ref 0.0–0.5)
Eosinophils Relative: 1 %
HCT: 42.7 % (ref 36.0–46.0)
Hemoglobin: 14.1 g/dL (ref 12.0–15.0)
Immature Granulocytes: 1 %
Lymphocytes Relative: 8 %
Lymphs Abs: 1 10*3/uL (ref 0.7–4.0)
MCH: 30.1 pg (ref 26.0–34.0)
MCHC: 33 g/dL (ref 30.0–36.0)
MCV: 91 fL (ref 80.0–100.0)
Monocytes Absolute: 1 10*3/uL (ref 0.1–1.0)
Monocytes Relative: 8 %
Neutro Abs: 11.1 10*3/uL — ABNORMAL HIGH (ref 1.7–7.7)
Neutrophils Relative %: 82 %
Platelets: 235 10*3/uL (ref 150–400)
RBC: 4.69 MIL/uL (ref 3.87–5.11)
RDW: 14.5 % (ref 11.5–15.5)
WBC: 13.4 10*3/uL — ABNORMAL HIGH (ref 4.0–10.5)
nRBC: 0 % (ref 0.0–0.2)

## 2020-03-10 MED ORDER — ONDANSETRON 4 MG PO TBDP: 4.0000 mg | ORAL_TABLET | Freq: Four times a day (QID) | ORAL | 0 refills | Status: DC | PRN

## 2020-03-10 MED ORDER — PANTOPRAZOLE SODIUM 40 MG PO TBEC: 40.0000 mg | DELAYED_RELEASE_TABLET | Freq: Every day | ORAL | 0 refills | Status: DC

## 2020-03-10 MED ORDER — AMLODIPINE BESYLATE 5 MG PO TABS: 5.0000 mg | ORAL_TABLET | Freq: Every day | ORAL | 1 refills | Status: DC

## 2020-03-10 MED ORDER — OXYCODONE HCL 5 MG PO TABS: 5.0000 mg | ORAL_TABLET | Freq: Four times a day (QID) | ORAL | 0 refills | Status: DC | PRN

## 2020-03-10 NOTE — Discharge Summary (Signed)
Physician Discharge Summary  Patient ID: Julia Davis MRN: [id number] DOB/AGE: 22-Aug-1986 34 y.o.  PCP: Caren Macadam, MD  Admit date: 03/08/2020 Discharge date: 03/10/2020  Admission Diagnoses:  Morbid obesity with lapband obstruction when seated  Discharge Diagnoses:  Morbid obesity, hypertension, post sleeve gastrectomy  Principal Problem:   History of laparoscopic adjustable gastric banding-[facility] in [place] 2013 Active Problems:   S/P laparoscopic sleeve gastrectomy   Surgery:  Removal of lapband and conversion to sleeve gastrectomy  Discharged Condition: improved  Hospital Course:   Had surgery.  Nauseated and hypertensive.  Started on Norvasc. Able to take po fluids and ready for discharge  Consults: none  Significant Diagnostic Studies: none    Discharge Exam: Blood pressure 120/82, pulse 70, temperature 98.6 F (37 C), temperature source Oral, resp. rate 18, height 5\' 9"  (1.753 m), weight (!) 139.2 kg, last menstrual period 02/18/2020, SpO2 100 %. Incisions OK  Disposition: Discharge disposition: 01-Home or Self Care       Discharge Instructions    Ambulate hourly while awake   Complete by: As directed    Call MD for:  difficulty breathing, headache or visual disturbances   Complete by: As directed    Call MD for:  persistant dizziness or light-headedness   Complete by: As directed    Call MD for:  persistant nausea and vomiting   Complete by: As directed    Call MD for:  redness, tenderness, or signs of infection (pain, swelling, redness, odor or green/yellow discharge around incision site)   Complete by: As directed    Call MD for:  severe uncontrolled pain   Complete by: As directed    Call MD for:  temperature >101 F   Complete by: As directed    Diet bariatric full liquid   Complete by: As directed    Incentive spirometry   Complete by: As directed    Perform hourly while awake     Allergies as of 03/10/2020   No Known Allergies      Medication List    TAKE these medications   amLODipine 5 MG tablet Commonly known as: NORVASC Take 1 tablet (5 mg total) by mouth daily. Start taking on: March 11, 2020   ibuprofen 200 MG tablet Commonly known as: ADVIL Take 600 mg by mouth every 8 (eight) hours as needed (for pain.). Notes to patient: Avoid NSAIDs for 6-8 weeks after surgery   levonorgestrel 20 MCG/24HR IUD Commonly known as: MIRENA 1 each by Intrauterine route once.   ondansetron 4 MG disintegrating tablet Commonly known as: ZOFRAN-ODT Take 1 tablet (4 mg total) by mouth every 6 (six) hours as needed for nausea or vomiting.   oxyCODONE 5 MG immediate release tablet Commonly known as: Oxy IR/ROXICODONE Take 1 tablet (5 mg total) by mouth every 6 (six) hours as needed for severe pain.   pantoprazole 40 MG tablet Commonly known as: PROTONIX Take 1 tablet (40 mg total) by mouth daily.   TURMERIC PO Take 1 capsule by mouth daily.      Follow-up Information    Surgery, [place]. Go on 03/31/2020.   Specialty: General Surgery Why: Your appointment is with Dr Grayland Jack 915 am.  Please arrive 15 minutes prior to your appointment. Contact information: [address] [place] [address] [phone]        Carlena Hurl, PA-C. Go on 04/29/2020.   Specialty: General Surgery Why: at 900 am Contact information: [place]  [place] [address] [phone]           Signed: [name] 03/10/2020, 4:14 PM

## 2020-03-10 NOTE — Progress Notes (Signed)
Patient alert and oriented, Post op day 2.  Provided support and encouragement.  Encouraged pulmonary toilet, ambulation and small sips of liquids.  Patient with vague nausea this morning but stated it is better than yesterday, started protein shake.  BP elevated provided Norvasc.  All questions answered.  Will continue to monitor.

## 2020-03-10 NOTE — Progress Notes (Signed)
Patient alert and oriented, pain is controlled. Patient is tolerating fluids, advanced to protein shake today, patient is tolerating well. Reviewed Gastric Bypass discharge instructions with patient and patient is able to articulate understanding. Provided information on BELT program, Support Group and [facility] outpatient pharmacy. All questions answered, will continue to monitor.   Total fluid intake 660

## 2020-03-10 NOTE — Progress Notes (Signed)
D/C instructions given to patient by Bariatric nurse. Patient has no questions. NT or writer will wheel patient out once her family comes to pick her up

## 2020-03-10 NOTE — Progress Notes (Signed)
Patient ID: Julia Davis, female   DOB: 1986-06-21, 34 y.o.   MRN: [id number]  [facility] Surgery Progress Note:   2 Days Post-Op  Subjective: Mental status is clear;  Complaining of nausea  Objective: Vital signs in last 24 hours: Temp:  [98.6 F (37 C)-99.6 F (37.6 C)] 99.3 F (37.4 C) (03/24 0625) Pulse Rate:  [53-70] 70 (03/24 0625) Resp:  [16-18] 18 (03/24 0625) BP: (152-183)/(92-112) 158/109 (03/24 0625) SpO2:  [90 %-99 %] 90 % (03/24 0625)  Intake/Output from previous day: 03/23 0701 - 03/24 0700 In: 1983.6 [P.O.:150; I.V.:1833.6] Out: 3301 [Urine:3300; Emesis/NG output:1] Intake/Output this shift: Total I/O In: 1201.2 [P.O.:30; I.V.:1171.2] Out: 2501 [Urine:2500; Emesis/NG output:1]  Physical Exam: Work of breathing is normal.  Pain controlled  Lab Results:  Results for orders placed or performed during the hospital encounter of 03/08/20 (from the past 48 hour(s))  Glucose, capillary     Status: Abnormal   Collection Time: 03/08/20 10:33 AM  Result Value Ref Range   Glucose-Capillary 132 (H) 70 - 99 mg/dL    Comment: Glucose reference range applies only to samples taken after fasting for at least 8 hours.   Comment 1 Notify RN   CBC     Status: None   Collection Time: 03/08/20 11:05 AM  Result Value Ref Range   WBC 9.6 4.0 - 10.5 K/uL   RBC 4.61 3.87 - 5.11 MIL/uL   Hemoglobin 13.7 12.0 - 15.0 g/dL   HCT 43.2 36.0 - 46.0 %   MCV 93.7 80.0 - 100.0 fL   MCH 29.7 26.0 - 34.0 pg   MCHC 31.7 30.0 - 36.0 g/dL   RDW 14.9 11.5 - 15.5 %   Platelets 237 150 - 400 K/uL   nRBC 0.0 0.0 - 0.2 %    Comment: Performed at [facility], [place] [address], [place], [place] [address]  Creatinine, serum     Status: Abnormal   Collection Time: 03/08/20 11:05 AM  Result Value Ref Range   Creatinine, Ser 1.12 (H) 0.44 - 1.00 mg/dL   GFR calc non Af Amer >60 >60 mL/min   GFR calc Af Amer >60 >60 mL/min    Comment: Performed at [facility], [place] [address], [place], [place] [address]  Hemoglobin and hematocrit, blood     Status: None   Collection Time: 03/08/20  2:38 PM  Result Value Ref Range   Hemoglobin 13.8 12.0 - 15.0 g/dL   HCT 42.6 36.0 - 46.0 %    Comment: Performed at [facility], [place] [address] [place], [place] [address]  CBC WITH DIFFERENTIAL     Status: Abnormal   Collection Time: 03/09/20  4:35 AM  Result Value Ref Range   WBC 12.7 (H) 4.0 - 10.5 K/uL   RBC 4.32 3.87 - 5.11 MIL/uL   Hemoglobin 12.9 12.0 - 15.0 g/dL   HCT 40.5 36.0 - 46.0 %   MCV 93.8 80.0 - 100.0 fL   MCH 29.9 26.0 - 34.0 pg   MCHC 31.9 30.0 - 36.0 g/dL   RDW 14.8 11.5 - 15.5 %   Platelets 227 150 - 400 K/uL   nRBC 0.0 0.0 - 0.2 %   Neutrophils Relative % 81 %   Neutro Abs 10.4 (H) 1.7 - 7.7 K/uL   Lymphocytes Relative 6 %   Lymphs Abs 0.7 0.7 - 4.0 K/uL   Monocytes Relative 9 %   Monocytes Absolute 1.1 (H) 0.1 - 1.0  K/uL   Eosinophils Relative 3 %   Eosinophils Absolute 0.4 0.0 - 0.5 K/uL   Basophils Relative 0 %   Basophils Absolute 0.0 0.0 - 0.1 K/uL   Immature Granulocytes 1 %   Abs Immature Granulocytes 0.06 0.00 - 0.07 K/uL    Comment: Performed at [facility], [address] [place], [place] [address]  Glucose, capillary     Status: Abnormal   Collection Time: 03/09/20  9:25 AM  Result Value Ref Range   Glucose-Capillary 149 (H) 70 - 99 mg/dL    Comment: Glucose reference range applies only to samples taken after fasting for at least 8 hours.  CBC with Differential     Status: Abnormal   Collection Time: 03/10/20  4:44 AM  Result Value Ref Range   WBC 13.4 (H) 4.0 - 10.5 K/uL    Comment: WHITE COUNT CONFIRMED ON SMEAR   RBC 4.69 3.87 - 5.11 MIL/uL   Hemoglobin 14.1 12.0 - 15.0 g/dL   HCT 70.4 88.8 - 91.6 %   MCV 91.0 80.0 - 100.0 fL   MCH 30.1 26.0 - 34.0 pg   MCHC 33.0 30.0 - 36.0 g/dL   RDW 94.5 03.8 - 88.2 %   Platelets 235 150 - 400 K/uL   nRBC 0.0 0.0 - 0.2 %    Neutrophils Relative % 82 %   Neutro Abs 11.1 (H) 1.7 - 7.7 K/uL   Lymphocytes Relative 8 %   Lymphs Abs 1.0 0.7 - 4.0 K/uL   Monocytes Relative 8 %   Monocytes Absolute 1.0 0.1 - 1.0 K/uL   Eosinophils Relative 1 %   Eosinophils Absolute 0.2 0.0 - 0.5 K/uL   Basophils Relative 0 %   Basophils Absolute 0.0 0.0 - 0.1 K/uL   Immature Granulocytes 1 %   Abs Immature Granulocytes 0.07 0.00 - 0.07 K/uL    Comment: Performed at [facility], [address] [place], [place] [address]    Radiology/Results: No results found.  Anti-infectives: Anti-infectives (From admission, onward)   Start     Dose/Rate Route Frequency Ordered Stop   03/08/20 0600  cefoTEtan (CEFOTAN) 2 g in sodium chloride 0.9 % 100 mL IVPB     2 g 200 mL/hr over 30 Minutes Intravenous On call to O.R. 03/08/20 0529 03/08/20 1912      Assessment/Plan: Problem List: Patient Active Problem List   Diagnosis Date Noted  . S/P laparoscopic sleeve gastrectomy 03/08/2020  . History of laparoscopic adjustable gastric banding-APL in [facility] 2013 03/07/2020  . Obesity 02/27/2020    Hypertension particulary diastolic and nausea have been main issues.  Norvasc begun and antiemetic given.  Not ready for discharge.   2 Days Post-Op   LOS 1 day  Matt B. Daphine Deutscher, MD, [facility] [phone] beeper [phone]  03/10/2020 6:50 AM

## 2020-03-15 ENCOUNTER — Telehealth (HOSPITAL_COMMUNITY): Payer: Self-pay

## 2020-03-15 NOTE — Telephone Encounter (Addendum)
Patient called to discuss post bariatric surgery follow up questions.  Message left for patient 3/29 at 1203pm with contact information provided. Await return call    1.  Tell me about your pain and pain management?denies  2.  Let's talk about fluid intake.  How much total fluid are you taking in?64+  3.  How much protein have you taken in the last 2 days?60 grams  4.  Have you had nausea?  Tell me about when have experienced nausea and what you did to help?denies  5.  Has the frequency or color changed with your urine?urine light in color  6.  Tell me what your incisions look like?incisions ok  7.  Have you been passing gas? BM?had bm without difficulty  8.  If a problem or question were to arise who would you call?  Do you know contact numbers for [facility], [facility], and [facility]?aware of contact information for all services  9.  How has the walking going?waking regularly  10.  How are your vitamins and calcium going?  How are you taking them?started mvi and calcium

## 2020-03-23 ENCOUNTER — Ambulatory Visit: Payer: 59

## 2020-03-23 ENCOUNTER — Encounter: Payer: Medicaid Other | Attending: Surgery | Admitting: Dietician

## 2020-03-23 ENCOUNTER — Encounter: Payer: Self-pay | Admitting: Dietician

## 2020-03-23 DIAGNOSIS — E669 Obesity, unspecified: Secondary | ICD-10-CM | POA: Insufficient documentation

## 2020-03-23 NOTE — Progress Notes (Signed)
2 Week Post-Op Nutrition Appointment Bariatric Nutrition Education   MyChart Visit  Patient was seen via [place] on 03/23/2020 for post-operative nutrition education at [facility]   Surgery date: 03/08/2020 Surgery type: LapBand to Sleeve  Start weight at [facility]: 318.3 lbs (date: 12/23/2019) Weight today: 290 lbs (pt reported) BMI: 42.8  Pt reports occasional light headedness when stands too quickly and notices not much energy when physically active. Pt reports occasional constipation. No issues tolerating foods/fluids, meeting nutrition goals most days. Taking chewable bariatric MVI and calcium daily.   24-Hr Dietary Recall Decaf coffee + protein shake  Gatorade Zero  1/2 protein shake + yogurt  Water Broth   Est daily fluid intake: ~64 oz  Est daily protein intake: 55+ grams  The following the learning objectives were met by the patient during this course:  Identifies Phase 3 (Soft, High Protein Foods) Dietary Goals and will begin from 2 weeks post-operatively to 2 months post-operatively  Identifies appropriate sources of fluids and proteins   States protein recommendations and appropriate sources post-operatively  Identifies the need for appropriate texture modifications, mastication, and bite sizes when consuming solids  Identifies appropriate multivitamin and calcium sources post-operatively  Describes the need for physical activity post-operatively and will follow MD recommendations  States when to call healthcare provider regarding medication questions or post-operative complications  Handouts given include:  Phase 3 Protein Only  Phase 3 Meal Ideas  Follow-Up Plan: Patient will follow-up at [facility] via MyChart in 6 weeks for 2 month post-op nutrition visit for diet advancement per MD.

## 2020-03-30 ENCOUNTER — Telehealth: Payer: Self-pay | Admitting: Dietician

## 2020-03-30 NOTE — Telephone Encounter (Signed)
I spoke with patient via telephone to assess fluid intake and food tolerance since diet advancement to solid protein foods on 03/23/2020.  Surgery Date: 03/08/2020  Surgery Type: LapBand to Sleeve  Daily fluid intake: 64 ounces Daily protein intake: 60 grams  Patient states she has tried a couple new solid foods since transitioning to solid proteins and they have both gone well. Foods include high protein yogurt, protein shakes, and ground chicken with ricotta. Would like to work on increasing solid food intake to get more protein in. No issues or concerns reported at this time.     Herschel [place]) Meryl Ponder, MS, RD, LDN

## 2020-05-05 ENCOUNTER — Encounter: Payer: Self-pay | Admitting: Dietician

## 2020-05-05 ENCOUNTER — Encounter: Payer: No Typology Code available for payment source | Attending: Surgery | Admitting: Dietician

## 2020-05-05 DIAGNOSIS — E669 Obesity, unspecified: Secondary | ICD-10-CM | POA: Insufficient documentation

## 2020-05-05 NOTE — Progress Notes (Signed)
Bariatric Nutrition Follow-Up Visit Medical Nutrition Therapy   MyChart Visit  2 Months Post-Operative Lap Band to Sleeve Gastrectomy Surgery Surgery Date: 03/08/2020  Pt's Expectations of Surgery/ Goals: to achieve a healthy weight with the sleeve to be a tool Pt Reported Successes: more energy     NUTRITION ASSESSMENT  Anthropometrics  Start weight at [facility]: 318.3 lbs (date: 12/23/2019) Today's weight: N/A (MyChart Visit)  Lifestyle & Dietary Hx Patient states she is concerned because her weight has stalled for the past ~3 weeks. States she tolerates foods/fluids well but has a hard time getting in all the fluids throughout the day. Typical meal pattern is 3 meals plus 1 protein shake per day and maybe a snack. May also have a burger, shrimp, or other protein. Works with a Systems analyst 3x/week.   24-Hr Dietary Recall First Meal: protein shake  Snack: just crack an egg   Second Meal: salmon  Snack: sugar-free popsicles   Third Meal: salmon  Snack: - Beverages: Gatorade Zero, water  Estimated daily fluid intake: <64 oz Estimated daily protein intake: 60 g Supplements: ran out 1 week ago Current average weekly physical activity: weight training 3-4 times/week    Post-Op Goals/ Signs/ Symptoms Using straws: no Drinking while eating: no Chewing/swallowing difficulties: no Changes in vision: no Changes to mood/headaches: no Hair loss/changes to skin/nails: no Difficulty focusing/concentrating: no Sweating: no Dizziness/lightheadedness: no Palpitations: no  Carbonated/caffeinated beverages: no N/V/D/C/Gas: constipation  Abdominal pain: no Dumping syndrome: no   NUTRITION DIAGNOSIS  Overweight/obesity (Bertie-3.3) related to past poor dietary habits and physical inactivity as evidenced by completed bariatric surgery and following dietary guidelines for continued weight loss and healthy nutrition status.   NUTRITION INTERVENTION Nutrition counseling (C-1) and education  (E-2) to facilitate bariatric surgery goals, including: . Diet advancement to the next phase (phase 4) now including non-starchy vegetables  . The importance of consuming adequate calories as well as certain nutrients daily due to the body's need for essential vitamins, minerals, and fats . The importance of daily physical activity and to reach a goal of at least 150 minutes of moderate to vigorous physical activity weekly (or as directed by their physician) due to benefits such as increased musculature and improved lab values  Handouts Provided Include   Phase 4: Protein + Non-Starchy Vegetables  (provided via email at [email or web address])   Learning Style & Readiness for Change Teaching method utilized: Visual & Auditory  Demonstrated degree of understanding via: Teach Back  Barriers to learning/adherence to lifestyle change: None Identified    MONITORING & EVALUATION Dietary intake, weekly physical activity, body weight, and goals in 4 months.  Next Steps Patient is to follow-up in 4 months for 6 month post-op follow-up.

## 2020-08-31 ENCOUNTER — Encounter: Payer: No Typology Code available for payment source | Attending: Surgery | Admitting: Dietician

## 2020-08-31 DIAGNOSIS — E669 Obesity, unspecified: Secondary | ICD-10-CM | POA: Insufficient documentation

## 2020-12-28 ENCOUNTER — Encounter (HOSPITAL_COMMUNITY): Payer: Self-pay

## 2021-02-25 ENCOUNTER — Ambulatory Visit (INDEPENDENT_AMBULATORY_CARE_PROVIDER_SITE_OTHER): Payer: Worker's Compensation

## 2021-02-25 ENCOUNTER — Ambulatory Visit (HOSPITAL_COMMUNITY)
Admission: EM | Admit: 2021-02-25 | Discharge: 2021-02-25 | Disposition: A | Discharge status: Home / Self Care | Payer: Worker's Compensation | Attending: Family Medicine | Admitting: Family Medicine

## 2021-02-25 ENCOUNTER — Encounter (HOSPITAL_COMMUNITY): Payer: Self-pay | Admitting: Emergency Medicine

## 2021-02-25 ENCOUNTER — Other Ambulatory Visit: Payer: Self-pay

## 2021-02-25 DIAGNOSIS — M25561 Pain in right knee: Secondary | ICD-10-CM

## 2021-02-25 DIAGNOSIS — W19XXXA Unspecified fall, initial encounter: Secondary | ICD-10-CM

## 2021-02-25 DIAGNOSIS — M25562 Pain in left knee: Secondary | ICD-10-CM

## 2021-02-25 MED ORDER — KETOROLAC TROMETHAMINE 60 MG/2ML IM SOLN
60.0000 mg | Freq: Once | INTRAMUSCULAR | Status: AC
  Administered 2021-02-25: 60 mg via INTRAMUSCULAR

## 2021-02-25 MED ORDER — KETOROLAC TROMETHAMINE 60 MG/2ML IM SOLN
INTRAMUSCULAR | Status: AC
  Filled 2021-02-25: qty 2

## 2021-02-25 NOTE — ED Triage Notes (Signed)
Pt presents with bilateral knee pain after slipping on wet floor at work Monday night and falling on knees. States knees are slightly swollen.

## 2021-02-27 NOTE — ED Provider Notes (Signed)
[facility]    CSN: [id number] Arrival date & time: 02/25/21  1756      History   Chief Complaint Chief Complaint  Patient presents with  . Knee Pain    Bilateral    HPI Julia Davis is a 35 y.o. female.   Presenting today with bilateral knee pain and swelling after slipping and falling forward onto her knees 3 days ago.  Knees are stiff and painful with weightbearing but able to walk on both.  Denies diffuse swelling down legs, numbness, tingling, locking up.  So far using ice and over-the-counter pain relievers with mild temporary relief.  Does note history of knee pain off and on.     Past Medical History:  Diagnosis Date  . Abnormal Pap smear of cervix    age 25-neg colposcopy--no treatment  . Arthritis of knee, left   . Asthma    mild, seasonal  . Diabetes mellitus without complication (HCC)    gestational only  . Dysmenorrhea   . History of hiatal hernia    history of  . History of iron deficiency anemia   . Low vitamin D level 2020  . OSA on CPAP    history of, lost weight  . STD (sexually transmitted disease)    Hx HSV II  . Trichomonas infection 2020    Patient Active Problem List   Diagnosis Date Noted  . S/P laparoscopic sleeve gastrectomy 03/08/2020  . History of laparoscopic adjustable gastric banding-APL in [facility] 2013 03/07/2020  . Obesity 02/27/2020    Past Surgical History:  Procedure Laterality Date  . CESAREAN SECTION  2006, 2012   delivered in [place]  . CHOLECYSTECTOMY    . FOOT SURGERY Left   . KNEE SURGERY Right    x2  . LAPAROSCOPIC GASTRIC BAND REMOVAL WITH LAPAROSCOPIC GASTRIC SLEEVE RESECTION N/A 03/08/2020   Procedure: LAPAROSCOPIC GASTRIC BAND REMOVAL WITH LAPAROSCOPIC GASTRIC SLEEVE RESECTION, Upper Endo, ERAS Pathway;  Surgeon: Luretha Murphy, MD;  Location: [facility];  Service: General;  Laterality: N/A;  . LAPAROSCOPIC GASTRIC BANDING      OB History    Gravida  2   Para  2   Term  2   Preterm       AB      Living  2     SAB      IAB      Ectopic      Multiple      Live Births               Home Medications    Prior to Admission medications   Medication Sig Start Date End Date Taking? Authorizing Provider  amLODipine (NORVASC) 5 MG tablet Take 1 tablet (5 mg total) by mouth daily. 03/11/20   Luretha Murphy, MD  ibuprofen (ADVIL) 200 MG tablet Take 600 mg by mouth every 8 (eight) hours as needed (for pain.).    [provider]  levonorgestrel (MIRENA) 20 MCG/24HR IUD 1 each by Intrauterine route once.    [provider]  ondansetron (ZOFRAN-ODT) 4 MG disintegrating tablet Take 1 tablet (4 mg total) by mouth every 6 (six) hours as needed for nausea or vomiting. 03/10/20   Luretha Murphy, MD  oxyCODONE (OXY IR/ROXICODONE) 5 MG immediate release tablet Take 1 tablet (5 mg total) by mouth every 6 (six) hours as needed for severe pain. 03/10/20   Luretha Murphy, MD  pantoprazole (PROTONIX) 40 MG tablet Take 1 tablet (40 mg total) by mouth  daily. 03/10/20   Luretha Murphy, MD  TURMERIC PO Take 1 capsule by mouth daily.    [provider]    Family History Family History  Problem Relation Age of Onset  . Osteoarthritis Mother   . Diabetes Father   . Diabetes Maternal Grandfather   . Hypertension Maternal Grandfather     Social History Social History   Tobacco Use  . Smoking status: Former Smoker    Packs/day: 0.25    Years: 12.00    Pack years: 3.00    Types: Cigarettes    Quit date: 2021    Years since quitting: 1.1  . Smokeless tobacco: Never Used  Vaping Use  . Vaping Use: Never used  Substance Use Topics  . Alcohol use: Not Currently    Alcohol/week: 5.0 standard drinks    Types: 5 Glasses of wine per week  . Drug use: Yes    Types: Marijuana    Comment: occ.     Allergies   Patient has no known allergies.   Review of Systems Review of Systems Per HPI  Physical Exam Triage Vital Signs ED Triage Vitals  Enc  Vitals Group     BP 02/25/21 1837 (!) 141/100     Pulse Rate 02/25/21 1837 76     Resp 02/25/21 1837 16     Temp 02/25/21 1837 98.9 F (37.2 C)     Temp Source 02/25/21 1837 Oral     SpO2 02/25/21 1837 98 %     Weight --      Height --      Head Circumference --      Peak Flow --      Pain Score 02/25/21 1834 7     Pain Loc --      Pain Edu? --      Excl. in GC? --    No data found.  Updated Vital Signs BP (!) 141/100 (BP Location: Left Arm)   Pulse 76   Temp 98.9 F (37.2 C) (Oral)   Resp 16   LMP 01/28/2021   SpO2 98%   Visual Acuity Right Eye Distance:   Left Eye Distance:   Bilateral Distance:    Right Eye Near:   Left Eye Near:    Bilateral Near:     Physical Exam Vitals and nursing note reviewed.  Constitutional:      Appearance: Normal appearance. She is not ill-appearing.  HENT:     Head: Atraumatic.     Mouth/Throat:     Mouth: Mucous membranes are moist.  Eyes:     Extraocular Movements: Extraocular movements intact.     Conjunctiva/sclera: Conjunctivae normal.  Cardiovascular:     Rate and Rhythm: Normal rate and regular rhythm.     Heart sounds: Normal heart sounds.  Pulmonary:     Effort: Pulmonary effort is normal.     Breath sounds: Normal breath sounds.  Musculoskeletal:        General: Swelling, tenderness and signs of injury present. Normal range of motion.     Cervical back: Normal range of motion and neck supple.     Comments: Trace edema left anterior knee, mild edema to right anterior knee with diffuse anterior tenderness palpation Range of motion intact but painful per patient, some crepitus present No joint laxity on drawer testing  Skin:    General: Skin is warm and dry.     Findings: Bruising present.     Comments: Minimal bruising bilateral knees worst  on right side anteriorly  Neurological:     Mental Status: She is alert and oriented to person, place, and time.  Psychiatric:        Mood and Affect: Mood normal.         Thought Content: Thought content normal.        Judgment: Judgment normal.      UC Treatments / Results  Labs (all labs ordered are listed, but only abnormal results are displayed) Labs Reviewed - No data to display  EKG   Radiology DG Knee Complete 4 Views Right  Result Date: 02/25/2021 CLINICAL DATA:  Fall, pain EXAM: RIGHT KNEE - COMPLETE 4+ VIEW COMPARISON:  None. FINDINGS: No fracture or dislocation of the right knee. Moderate tricompartmental joint space narrowing and osteophytosis. Moderate, nonspecific knee joint effusion. Soft tissue edema anteriorly. IMPRESSION: 1.  No fracture or dislocation of the right knee. 2.  Moderate tricompartmental arthrosis. 3.  Moderate, nonspecific knee joint effusion. 4.  Soft tissue edema anteriorly. Electronically Signed   By: Lauralyn Primes M.D.   On: 02/25/2021 19:40    Procedures Procedures (including critical care time)  Medications Ordered in UC Medications  ketorolac (TORADOL) injection 60 mg (60 mg Intramuscular Given 02/25/21 1956)    Initial Impression / Assessment and Plan / UC Course  I have reviewed the triage vital signs and the nursing notes.  Pertinent labs & imaging results that were available during my care of the patient were reviewed by me and considered in my medical decision making (see chart for details).     Right knee x-ray showing degenerative changes but no acute abnormalities.  Suspect contusions from impact of fall causing her bilateral knee pain.  IM Toradol given in clinic, discussed over-the-counter pain relievers, rice protocol, rest.  Work note given.  Sports medicine follow-up if worsening or not resolving.  Final Clinical Impressions(s) / UC Diagnoses   Final diagnoses:  Acute pain of both knees  Fall, initial encounter   Discharge Instructions   None    ED Prescriptions    None     PDMP not reviewed this encounter.   Particia Nearing, [place] 02/27/21 [phone]

## 2021-02-28 ENCOUNTER — Ambulatory Visit (INDEPENDENT_AMBULATORY_CARE_PROVIDER_SITE_OTHER): Payer: Self-pay

## 2021-02-28 ENCOUNTER — Other Ambulatory Visit: Payer: Self-pay

## 2021-02-28 ENCOUNTER — Other Ambulatory Visit: Payer: Self-pay | Admitting: Nurse Practitioner

## 2021-02-28 DIAGNOSIS — R52 Pain, unspecified: Secondary | ICD-10-CM

## 2021-02-28 DIAGNOSIS — M25562 Pain in left knee: Secondary | ICD-10-CM

## 2021-04-26 DIAGNOSIS — J45909 Unspecified asthma, uncomplicated: Secondary | ICD-10-CM | POA: Insufficient documentation

## 2021-04-26 DIAGNOSIS — G4733 Obstructive sleep apnea (adult) (pediatric): Secondary | ICD-10-CM | POA: Insufficient documentation

## 2021-04-26 DIAGNOSIS — E119 Type 2 diabetes mellitus without complications: Secondary | ICD-10-CM | POA: Insufficient documentation

## 2021-05-18 ENCOUNTER — Other Ambulatory Visit: Payer: Self-pay

## 2021-07-21 IMAGING — DX DG KNEE COMPLETE 4+V*L*
4 series · 4 of 4 positions shown · non-contrast
Comparison: None.

CLINICAL DATA: Slip and fall on wet floor at work 1 week ago
injuring left knee. Pain anteriorly.

EXAM:
LEFT KNEE - COMPLETE 4+ VIEW

[knee ap]
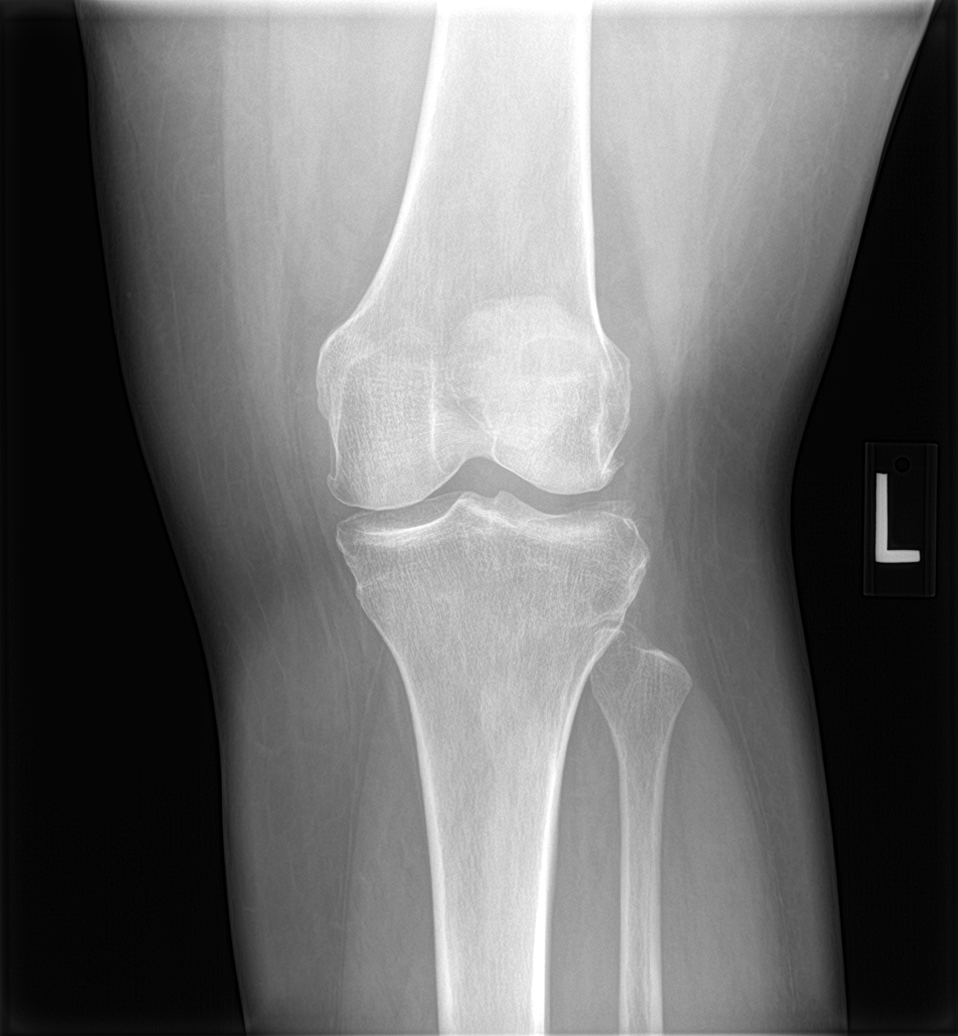

[knee lat]
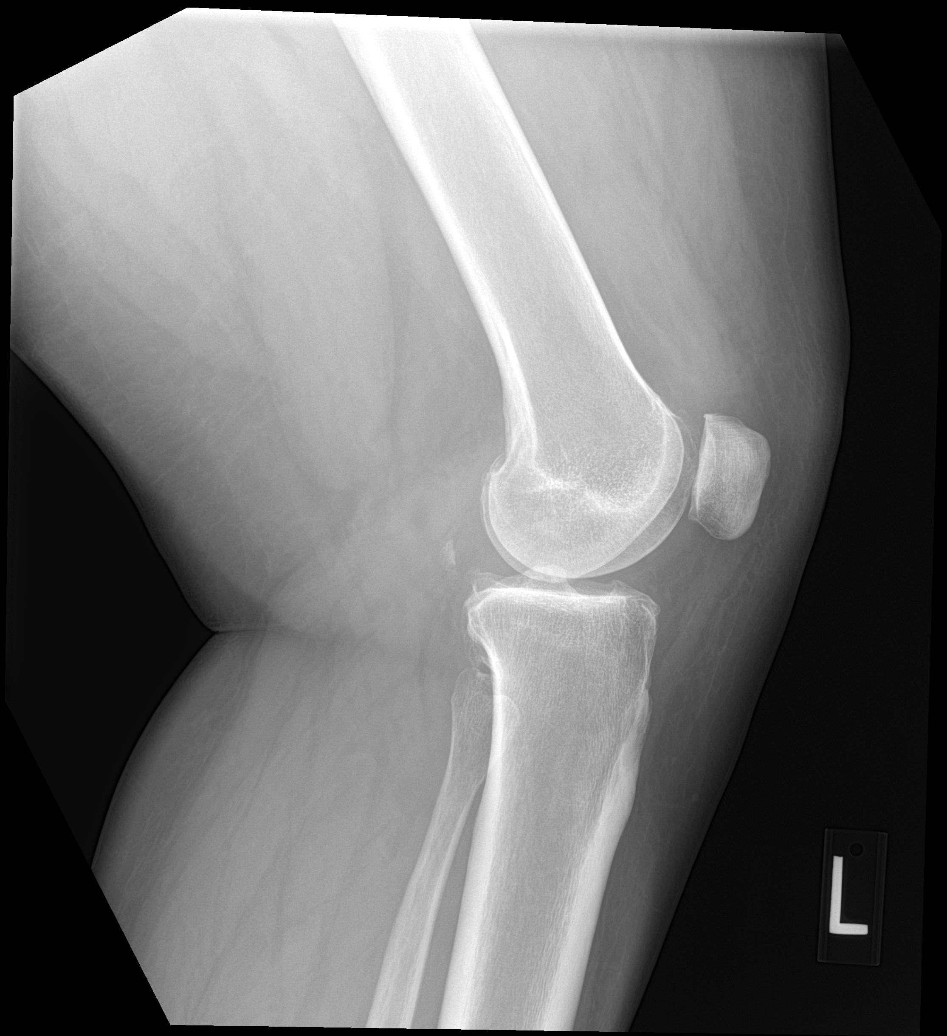

[knee obl (1 of 2)]
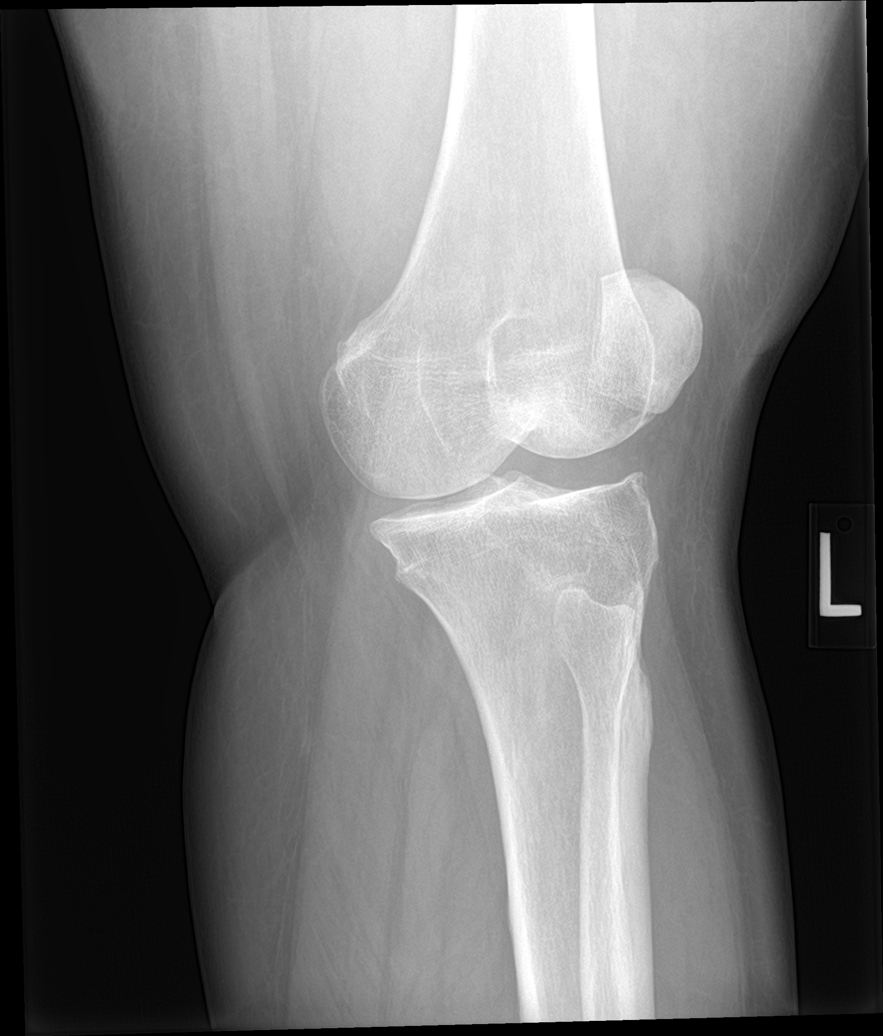

[knee obl (2 of 2)]
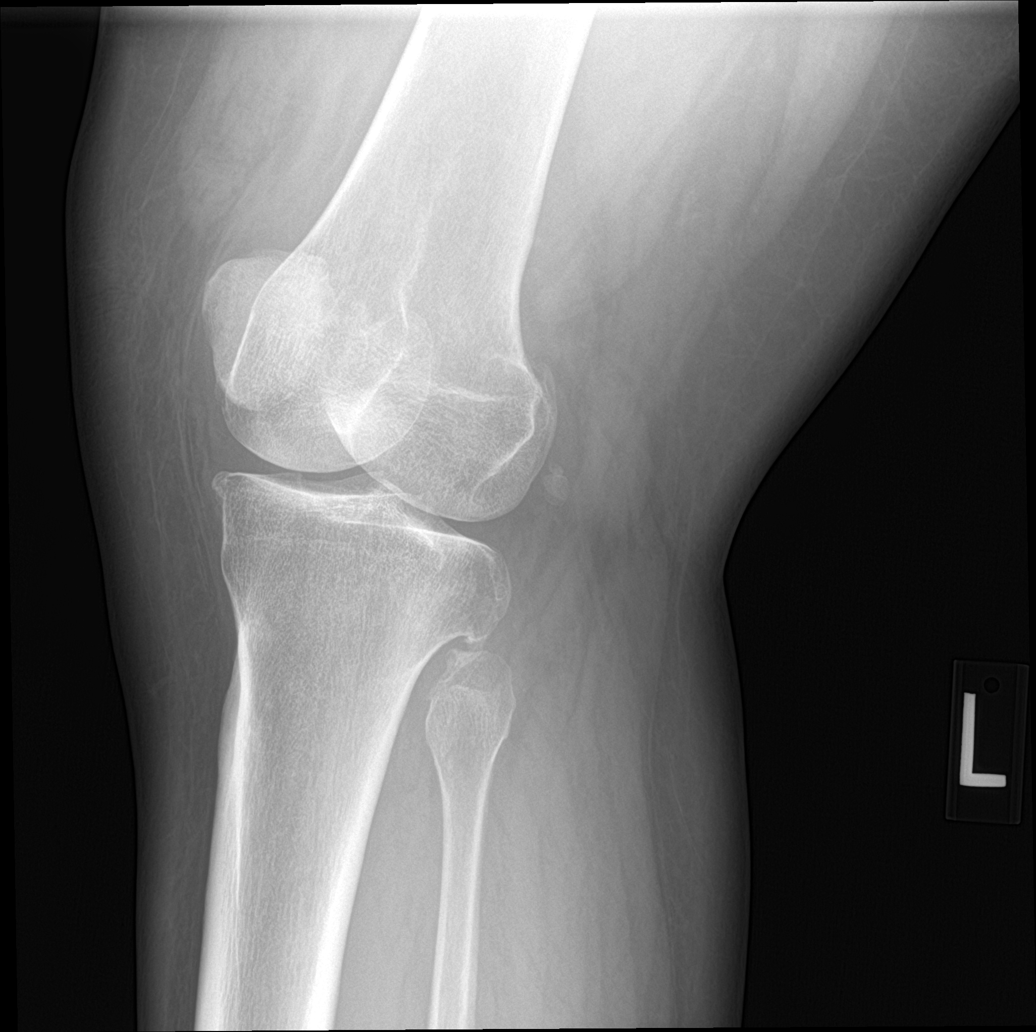

[4 of 4 positions shown; findings below may reference images not displayed]

FINDINGS: No fracture or dislocation. Mild tricompartmental peripheral
spurring with preservation of joint spaces. There is a small knee
joint effusion. Mild soft tissue edema anteriorly
IMPRESSION: 1. No fracture or dislocation.
2. Small knee joint effusion. Mild tricompartmental degenerative
spurring.

## 2021-10-06 ENCOUNTER — Encounter (HOSPITAL_COMMUNITY): Payer: Self-pay | Admitting: *Deleted

## 2022-04-24 NOTE — Progress Notes (Deleted)
New Patient Visit  There were no vitals taken for this visit.   Subjective:    Patient ID: Julia Davis, female    DOB: [birth year], 36 y.o.   MRN: [id number]  CC: No chief complaint on file.   HPI: Julia Davis is a 36 y.o. female who presents for new patient visit to establish care.  Introduced to Designer, jewellery role and practice setting.  All questions answered.  Discussed provider/patient relationship and expectations.   Past Medical History:  Diagnosis Date   Abnormal Pap smear of cervix    age 13-neg colposcopy--no treatment   Arthritis of knee, left    Asthma    mild, seasonal   Diabetes mellitus without complication ([place])    gestational only   Dysmenorrhea    History of hiatal hernia    history of   History of iron deficiency anemia    Low vitamin D level 2020   OSA on CPAP    history of, lost weight   STD (sexually transmitted disease)    Hx HSV II   Trichomonas infection 2020    Past Surgical History:  Procedure Laterality Date   CESAREAN SECTION  2006, 2012   delivered in [place] Left    KNEE SURGERY Right    x2   LAPAROSCOPIC GASTRIC BAND REMOVAL WITH LAPAROSCOPIC GASTRIC SLEEVE RESECTION N/A 03/08/2020   Procedure: LAPAROSCOPIC GASTRIC BAND REMOVAL WITH LAPAROSCOPIC GASTRIC SLEEVE RESECTION, Upper Endo, ERAS Pathway;  Surgeon: Johnathan Hausen, MD;  Location: [facility];  Service: General;  Laterality: N/A;   LAPAROSCOPIC GASTRIC BANDING      Family History  Problem Relation Age of Onset   Osteoarthritis Mother    Diabetes Father    Diabetes Maternal Grandfather    Hypertension Maternal Grandfather      Current Outpatient Medications on File Prior to Visit  Medication Sig Dispense Refill   amLODipine (NORVASC) 5 MG tablet Take 1 tablet (5 mg total) by mouth daily. 30 tablet 1   ibuprofen (ADVIL) 200 MG tablet Take 600 mg by mouth every 8 (eight) hours as needed (for pain.).     levonorgestrel  (MIRENA) 20 MCG/24HR IUD 1 each by Intrauterine route once.     metroNIDAZOLE (FLAGYL) 500 MG tablet metronidazole 500 mg tablet  TAKE 1 TABLET BY MOUTH TWICE A DAY FOR 7 DAYS     ondansetron (ZOFRAN-ODT) 4 MG disintegrating tablet Take 1 tablet (4 mg total) by mouth every 6 (six) hours as needed for nausea or vomiting. 20 tablet 0   oxyCODONE (OXY IR/ROXICODONE) 5 MG immediate release tablet Take 1 tablet (5 mg total) by mouth every 6 (six) hours as needed for severe pain. 10 tablet 0   pantoprazole (PROTONIX) 40 MG tablet Take 1 tablet (40 mg total) by mouth daily. 90 tablet 0   TURMERIC PO Take 1 capsule by mouth daily.     No current facility-administered medications on file prior to visit.     Review of Systems      Objective:    There were no vitals taken for this visit.  Wt Readings from Last 3 Encounters:  03/23/20 290 lb (131.5 kg)  03/08/20 (!) 306 lb 12.8 oz (139.2 kg)  03/02/20 (!) 313 lb 1 oz (142 kg)    BP Readings from Last 3 Encounters:  02/25/21 (!) 141/100  03/10/20 120/82  03/02/20 (!) 151/81    Physical Exam  Results for orders placed or  performed during the hospital encounter of 03/08/20  Glucose, capillary  Result Value Ref Range   Glucose-Capillary 101 (H) 70 - 99 mg/dL  Glucose, capillary  Result Value Ref Range   Glucose-Capillary 132 (H) 70 - 99 mg/dL   Comment 1 Notify RN   Hemoglobin and hematocrit, blood  Result Value Ref Range   Hemoglobin 13.8 12.0 - 15.0 g/dL   HCT 42.6 36.0 - 46.0 %  CBC  Result Value Ref Range   WBC 9.6 4.0 - 10.5 K/uL   RBC 4.61 3.87 - 5.11 MIL/uL   Hemoglobin 13.7 12.0 - 15.0 g/dL   HCT 43.2 36.0 - 46.0 %   MCV 93.7 80.0 - 100.0 fL   MCH 29.7 26.0 - 34.0 pg   MCHC 31.7 30.0 - 36.0 g/dL   RDW 14.9 11.5 - 15.5 %   Platelets 237 150 - 400 K/uL   nRBC 0.0 0.0 - 0.2 %  Creatinine, serum  Result Value Ref Range   Creatinine, Ser 1.12 (H) 0.44 - 1.00 mg/dL   GFR calc non Af Amer >60 >60 mL/min   GFR calc Af Amer  >60 >60 mL/min  CBC WITH DIFFERENTIAL  Result Value Ref Range   WBC 12.7 (H) 4.0 - 10.5 K/uL   RBC 4.32 3.87 - 5.11 MIL/uL   Hemoglobin 12.9 12.0 - 15.0 g/dL   HCT 40.5 36.0 - 46.0 %   MCV 93.8 80.0 - 100.0 fL   MCH 29.9 26.0 - 34.0 pg   MCHC 31.9 30.0 - 36.0 g/dL   RDW 14.8 11.5 - 15.5 %   Platelets 227 150 - 400 K/uL   nRBC 0.0 0.0 - 0.2 %   Neutrophils Relative % 81 %   Neutro Abs 10.4 (H) 1.7 - 7.7 K/uL   Lymphocytes Relative 6 %   Lymphs Abs 0.7 0.7 - 4.0 K/uL   Monocytes Relative 9 %   Monocytes Absolute 1.1 (H) 0.1 - 1.0 K/uL   Eosinophils Relative 3 %   Eosinophils Absolute 0.4 0.0 - 0.5 K/uL   Basophils Relative 0 %   Basophils Absolute 0.0 0.0 - 0.1 K/uL   Immature Granulocytes 1 %   Abs Immature Granulocytes 0.06 0.00 - 0.07 K/uL  Glucose, capillary  Result Value Ref Range   Glucose-Capillary 149 (H) 70 - 99 mg/dL  CBC with Differential  Result Value Ref Range   WBC 13.4 (H) 4.0 - 10.5 K/uL   RBC 4.69 3.87 - 5.11 MIL/uL   Hemoglobin 14.1 12.0 - 15.0 g/dL   HCT 42.7 36.0 - 46.0 %   MCV 91.0 80.0 - 100.0 fL   MCH 30.1 26.0 - 34.0 pg   MCHC 33.0 30.0 - 36.0 g/dL   RDW 14.5 11.5 - 15.5 %   Platelets 235 150 - 400 K/uL   nRBC 0.0 0.0 - 0.2 %   Neutrophils Relative % 82 %   Neutro Abs 11.1 (H) 1.7 - 7.7 K/uL   Lymphocytes Relative 8 %   Lymphs Abs 1.0 0.7 - 4.0 K/uL   Monocytes Relative 8 %   Monocytes Absolute 1.0 0.1 - 1.0 K/uL   Eosinophils Relative 1 %   Eosinophils Absolute 0.2 0.0 - 0.5 K/uL   Basophils Relative 0 %   Basophils Absolute 0.0 0.0 - 0.1 K/uL   Immature Granulocytes 1 %   Abs Immature Granulocytes 0.07 0.00 - 0.07 K/uL  Surgical pathology  Result Value Ref Range   SURGICAL PATHOLOGY  SURGICAL PATHOLOGY CASE: [id number] PATIENT: Julia Davis Surgical Pathology Report     Clinical History: Lab band malfunction/obstruction, DM II ([facility])     FINAL MICROSCOPIC DIAGNOSIS:  A. STOMACH, GASTRIC SLEEVE RESECTION: -Gross  diagnosis only: Portion of unremarkable stomach.  GROSS DESCRIPTION:  Received fresh is a 20.6 x 4.2 x 2.8 cm partial gastrectomy.  The margin is stapled.  The serosa is smooth and tan.  The mucosa is glistening tan-red with preservation of the rugal folds.  There are no discrete masses or areas of ulceration.  No sections are submitted.  [facility] 03/08/2020)   Final Diagnosis performed by Vicente Males, MD.   Electronically signed 03/10/2020 Technical and / or Professional components performed at [facility], [place] [address], [place], [place] [address].  Immunohistochemistry Technical component (if applicable) was performed at [facility]. [address], [place], [place] [address].   IMMUNOHISTOCHEMISTRY DISCLAIMER (if applicable): Some of these immunohistochemical stains may have been developed and the performance characteristics determine by [facility]. Some may not have been cleared or approved by the U.S. Food and Drug Administration. The FDA has determined that such clearance or approval is not necessary. This test is used for clinical purposes. It should not be regarded as investigational or for research. This laboratory is certified under the [place] (CLIA-88) as qualified to perform high complexity clinical laboratory testing.  The controls stained appropriately.       Assessment & Plan:   Problem List Items Addressed This Visit   None    Follow up plan: No follow-ups on file.

## 2022-04-25 ENCOUNTER — Ambulatory Visit (INDEPENDENT_AMBULATORY_CARE_PROVIDER_SITE_OTHER): Payer: Medicaid Other | Admitting: Nurse Practitioner

## 2022-04-25 ENCOUNTER — Encounter: Payer: Self-pay | Admitting: Nurse Practitioner

## 2022-04-25 ENCOUNTER — Ambulatory Visit: Payer: Self-pay | Admitting: Nurse Practitioner

## 2022-04-25 VITALS — BP 130/86 | HR 70 | Temp 97.0°F | Ht 69.0 in | Wt 304.6 lb

## 2022-04-25 DIAGNOSIS — F1010 Alcohol abuse, uncomplicated: Secondary | ICD-10-CM

## 2022-04-25 DIAGNOSIS — Z13220 Encounter for screening for lipoid disorders: Secondary | ICD-10-CM

## 2022-04-25 DIAGNOSIS — R21 Rash and other nonspecific skin eruption: Secondary | ICD-10-CM | POA: Insufficient documentation

## 2022-04-25 DIAGNOSIS — Z8632 Personal history of gestational diabetes: Secondary | ICD-10-CM

## 2022-04-25 DIAGNOSIS — K219 Gastro-esophageal reflux disease without esophagitis: Secondary | ICD-10-CM | POA: Diagnosis not present

## 2022-04-25 DIAGNOSIS — R5383 Other fatigue: Secondary | ICD-10-CM

## 2022-04-25 DIAGNOSIS — Z720 Tobacco use: Secondary | ICD-10-CM

## 2022-04-25 DIAGNOSIS — Z136 Encounter for screening for cardiovascular disorders: Secondary | ICD-10-CM

## 2022-04-25 DIAGNOSIS — F320 Major depressive disorder, single episode, mild: Secondary | ICD-10-CM | POA: Insufficient documentation

## 2022-04-25 MED ORDER — CLOTRIMAZOLE-BETAMETHASONE 1-0.05 % EX CREA: 1.0000 "application " | TOPICAL_CREAM | Freq: Every day | CUTANEOUS | 0 refills | Status: DC

## 2022-04-25 MED ORDER — SERTRALINE HCL 50 MG PO TABS: 50.0000 mg | ORAL_TABLET | Freq: Every day | ORAL | 1 refills | Status: DC

## 2022-04-25 NOTE — Assessment & Plan Note (Signed)
She has endorsed an increase in depressive symptoms over the past few years and her PHQ 9 is a 10. She states that she may be losing her job soon, which is making her even more stressed. She has been drinking about 3-4 alcoholic drinks per day after weaning herself down. Will place referral to psychiatry and start sertraline 50mg  daily. She denies SI/HI. Discussed possible side effects of medication. Follow-up in 3-4 weeks, sooner if concerns.  ?

## 2022-04-25 NOTE — Progress Notes (Signed)
? ?New Patient Visit ? ?BP 130/86 (BP Location: Left Arm, Patient Position: Sitting, Cuff Size: Large)   Pulse 70   Temp (!) 97 ?F (36.1 ?C) (Temporal)   Ht 5\' 9"  (1.753 m)   Wt (!) 304 lb 9.6 oz (138.2 kg)   SpO2 98%   BMI 44.98 kg/m?   ? ?Subjective:  ? ? Patient ID: Julia Davis, female    DOB: 06/17/1986, 36 y.o.   MRN: [id number] ? ?CC: ?Chief Complaint  ?Patient presents with  ? Establish Care  ?  Np. Est care. Overall health assessment. Pt is fasting  ? ? ?HPI: ?Julia Davis is a 36 y.o. female who presents for new patient visit to establish care.  Introduced to 30 role and practice setting.  All questions answered.  Discussed provider/patient relationship and expectations. ? ?She has a history of gastric band that was then removed and she had a gastric sleeve. Her heaviest weight was 380 pounds and her lowest weight after surgery was 255 pounds in 2017. She states that she drinks a lot which may be causing some of the weight gain. She states that this started during covid-19. She states that she was drinking excessively, and now she is drinking 3-4 glasses per day. She states that she may have some anxiety and depression. She states that she can go all day without eating and then after drinking, she will eat a lot. She has a 09-05-2005 and walks a lot at work.  ? ?Depression and Anxiety Screen: ? ? ?  04/25/2022  ? 10:58 AM  ?Depression screen PHQ 2/9  ?Decreased Interest 1  ?Down, Depressed, Hopeless 1  ?PHQ - 2 Score 2  ?Altered sleeping 3  ?Tired, decreased energy 3  ?Feeling bad or failure about yourself  1  ?Trouble concentrating 1  ?Moving slowly or fidgety/restless 0  ?Suicidal thoughts 0  ?PHQ-9 Score 10  ?Difficult doing work/chores Somewhat difficult  ? ? ?  04/25/2022  ? 10:58 AM  ?GAD 7 : Generalized Anxiety Score  ?Nervous, Anxious, on Edge 1  ?Control/stop worrying 0  ?Worry too much - different things 0  ?Trouble relaxing 0  ?Restless 0  ?Easily annoyed or  irritable 2  ?Afraid - awful might happen 0  ?Total GAD 7 Score 3  ?Anxiety Difficulty Somewhat difficult  ? ? ?Past Medical History:  ?Diagnosis Date  ? Abnormal Pap smear of cervix   ? age 15-neg colposcopy--no treatment  ? Arthritis of knee, left   ? Asthma   ? mild, seasonal  ? Diabetes mellitus without complication ([facility])   ? gestational only  ? Dysmenorrhea   ? History of hiatal hernia   ? history of  ? History of iron deficiency anemia   ? Low vitamin D level 2020  ? OSA on CPAP   ? history of, lost weight  ? STD (sexually transmitted disease)   ? Hx HSV II  ? Trichomonas infection 2020  ? ? ?Past Surgical History:  ?Procedure Laterality Date  ? CESAREAN SECTION  2006, 2012  ? delivered in 10-28-1973  ? CHOLECYSTECTOMY    ? FOOT SURGERY Left   ? HERNIA REPAIR    ? KNEE SURGERY Right   ? x2  ? LAPAROSCOPIC GASTRIC BAND REMOVAL WITH LAPAROSCOPIC GASTRIC SLEEVE RESECTION N/A 03/08/2020  ? Procedure: LAPAROSCOPIC GASTRIC BAND REMOVAL WITH LAPAROSCOPIC GASTRIC SLEEVE RESECTION, Upper Endo, ERAS Pathway;  Surgeon: 03/10/2020, MD;  Location: [facility];  Service: General;  Laterality:  N/A;  ? LAPAROSCOPIC GASTRIC BANDING    ? ? ?Family History  ?Problem Relation Age of Onset  ? Osteoarthritis Mother   ? Diabetes Father   ? Diabetes Maternal Grandfather   ? Hypertension Maternal Grandfather   ?  ? ?Current Outpatient Medications on File Prior to Visit  ?Medication Sig Dispense Refill  ? levonorgestrel (MIRENA) 20 MCG/24HR IUD 1 each by Intrauterine route once.    ? ibuprofen (ADVIL) 200 MG tablet Take 600 mg by mouth every 8 (eight) hours as needed (for pain.).    ? ondansetron (ZOFRAN-ODT) 4 MG disintegrating tablet Take 1 tablet (4 mg total) by mouth every 6 (six) hours as needed for nausea or vomiting. 20 tablet 0  ? pantoprazole (PROTONIX) 40 MG tablet Take 1 tablet (40 mg total) by mouth daily. 90 tablet 0  ? ?No current facility-administered medications on file prior to visit.  ?  ? ?Review of Systems   ?Constitutional:  Positive for fatigue.  ?HENT: Negative.    ?Eyes: Negative.   ?Respiratory: Negative.    ?Cardiovascular: Negative.   ?Gastrointestinal:  Positive for constipation. Negative for abdominal pain, diarrhea and nausea.  ?Genitourinary: Negative.   ?Musculoskeletal:  Positive for arthralgias (chronic knee pain).  ?Skin:  Positive for rash (under breasts).  ?Allergic/Immunologic: Positive for environmental allergies.  ?Neurological: Negative.   ?Psychiatric/Behavioral:  Positive for sleep disturbance. The patient is nervous/anxious.   ? ?   ?Objective:  ?  ?BP 130/86 (BP Location: Left Arm, Patient Position: Sitting, Cuff Size: Large)   Pulse 70   Temp (!) 97 ?F (36.1 ?C) (Temporal)   Ht 5\' 9"  (1.753 m)   Wt (!) 304 lb 9.6 oz (138.2 kg)   SpO2 98%   BMI 44.98 kg/m?   ?Wt Readings from Last 3 Encounters:  ?04/25/22 (!) 304 lb 9.6 oz (138.2 kg)  ?03/23/20 290 lb (131.5 kg)  ?03/08/20 (!) 306 lb 12.8 oz (139.2 kg)  ?  ?BP Readings from Last 3 Encounters:  ?04/25/22 130/86  ?02/25/21 (!) 141/100  ?03/10/20 120/82  ?  ?Physical Exam ?Vitals and nursing note reviewed.  ?Constitutional:   ?   General: She is not in acute distress. ?   Appearance: Normal appearance.  ?HENT:  ?   Head: Normocephalic and atraumatic.  ?   Right Ear: Tympanic membrane, ear canal and external ear normal.  ?   Left Ear: Tympanic membrane, ear canal and external ear normal.  ?   Nose: Nose normal.  ?   Mouth/Throat:  ?   Mouth: Mucous membranes are moist.  ?   Pharynx: Oropharynx is clear.  ?Eyes:  ?   Conjunctiva/sclera: Conjunctivae normal.  ?Cardiovascular:  ?   Rate and Rhythm: Normal rate and regular rhythm.  ?   Pulses: Normal pulses.  ?   Heart sounds: Normal heart sounds.  ?Pulmonary:  ?   Effort: Pulmonary effort is normal.  ?   Breath sounds: Normal breath sounds.  ?Abdominal:  ?   Palpations: Abdomen is soft.  ?   Tenderness: There is no abdominal tenderness.  ?Musculoskeletal:  ?   Cervical back: Normal range of  motion. No tenderness.  ?Lymphadenopathy:  ?   Cervical: No cervical adenopathy.  ?Skin: ?   General: Skin is warm and dry.  ?   Findings: Rash present.  ?   Comments: Red, scaly rash under left breast  ?Neurological:  ?   General: No focal deficit present.  ?   Mental Status:  She is alert and oriented to person, place, and time.  ?Psychiatric:     ?   Mood and Affect: Mood normal.     ?   Behavior: Behavior normal.     ?   Thought Content: Thought content normal.     ?   Judgment: Judgment normal.  ? ? ?CMP  ?   ?Component Value Date/Time  ? NA 136 03/02/2020 0843  ? NA 141 01/15/2019 0936  ? K 4.6 03/02/2020 0843  ? CL 103 03/02/2020 0843  ? CO2 24 03/02/2020 0843  ? GLUCOSE 92 03/02/2020 0843  ? BUN 12 03/02/2020 0843  ? BUN 10 01/15/2019 0936  ? CREATININE 1.12 (H) 03/08/2020 1105  ? CALCIUM 9.1 03/02/2020 0843  ? PROT 6.9 03/02/2020 0843  ? PROT 6.9 01/15/2019 0936  ? ALBUMIN 3.7 03/02/2020 0843  ? ALBUMIN 4.4 01/15/2019 0936  ? AST 19 03/02/2020 0843  ? ALT 14 03/02/2020 0843  ? ALKPHOS 46 03/02/2020 0843  ? BILITOT 1.2 03/02/2020 0843  ? BILITOT 0.5 01/15/2019 0936  ? GFRNONAA >60 03/08/2020 1105  ? GFRAA >60 03/08/2020 1105  ?  ?CBC ?   ?Component Value Date/Time  ? WBC 13.4 (H) 03/10/2020 0444  ? RBC 4.69 03/10/2020 0444  ? HGB 14.1 03/10/2020 0444  ? HGB 13.9 01/15/2019 0936  ? HCT 42.7 03/10/2020 0444  ? HCT 42.0 01/15/2019 0936  ? PLT 235 03/10/2020 0444  ? PLT 216 01/15/2019 0936  ? MCV 91.0 03/10/2020 0444  ? MCV 92 01/15/2019 0936  ? MCH 30.1 03/10/2020 0444  ? MCHC 33.0 03/10/2020 0444  ? RDW 14.5 03/10/2020 0444  ? RDW 13.9 01/15/2019 0936  ? LYMPHSABS 1.0 03/10/2020 0444  ? MONOABS 1.0 03/10/2020 0444  ? EOSABS 0.2 03/10/2020 0444  ? BASOSABS 0.0 03/10/2020 0444  ?  ?Lipid Panel  ?   ?Component Value Date/Time  ? CHOL 145 01/15/2019 0936  ? TRIG 48 01/15/2019 0936  ? HDL 68 01/15/2019 0936  ? CHOLHDL 2.1 01/15/2019 0936  ? LDLCALC 67 01/15/2019 0936  ? LABVLDL 10 01/15/2019 0936  ?  ?Results for  orders placed or performed during the hospital encounter of 03/08/20  ?Glucose, capillary  ?Result Value Ref Range  ? Glucose-Capillary 101 (H) 70 - 99 mg/dL  ?Glucose, capillary  ?Result Value Ref Range  ? Glucose-Capillary 132 (H) 70 -

## 2022-04-25 NOTE — Assessment & Plan Note (Signed)
She is currently smoking 1/4 ppd of cigarettes. Encouraged complete tobacco cessation.  ?

## 2022-04-25 NOTE — Assessment & Plan Note (Signed)
BMI 44.9. She states that her heaviest weight was 380 pounds and her lowest weight was 255 pounds in 2017. She has a history of lap band and then this was removed and she had a sleeve gastrectomy. She states that she has been gaining weight again since covid-19. She started drinking alcohol significantly, up to 1/2 pint of liquor daily. She has slowly weaned herself down, but is still drinking 3-4 drinks per day. We had a long discussion on how alcohol can lead to weight gain and overeating. She does endorse depression as well. Discussed helping her with her mental health and alcohol as the start to helping her with weight loss again. See plan below.  ?

## 2022-04-25 NOTE — Assessment & Plan Note (Signed)
She has a history of gestational diabetes. Will check A1C today.  ?

## 2022-04-25 NOTE — Assessment & Plan Note (Signed)
Red, dry rash under left breast. Discussed washing skin between skin folds and making sure they stay dry. Will treat with lotrisone cream daily. Follow-up if not improving.  ?

## 2022-04-25 NOTE — Assessment & Plan Note (Signed)
She endorses increased in drinking alcohol since Covid-19 pandemic started. She was drinking 1/2 pint of liquor per day, however has decreased recently to 3-4 glasses per day. She is interested in stopping, because it is impacting her weight. Discussed resources in the community including AA. She needs to continue decreasing alcohol consumption slowly, however she states that she has gone 3-4 days in a row without drinking alcohol without any problems. Follow-up in 3-4 weeks.  ?

## 2022-04-25 NOTE — Patient Instructions (Signed)
It was great to see you! ? ?We are checking your labs today and will let you know the results via mychart/phone.  ? ?Start sertraline 50mg  daily to help with depression and anxiety.  ? ?I have attached information for alcohol support and resources  ? ?Let's follow-up in 3-4 weeks, sooner if you have concerns. ? ?If a referral was placed today, you will be contacted for an appointment. Please note that routine referrals can sometimes take up to 3-4 weeks to process. Please call our office if you haven't heard anything after this time frame. ? ?Take care, ? ?Vance Peper, NP ?- ?

## 2022-04-25 NOTE — Assessment & Plan Note (Signed)
Chronic, stable.  Continue pantoprazole 40 mg daily. 

## 2022-04-28 ENCOUNTER — Other Ambulatory Visit (INDEPENDENT_AMBULATORY_CARE_PROVIDER_SITE_OTHER): Payer: No Typology Code available for payment source

## 2022-04-28 DIAGNOSIS — R5383 Other fatigue: Secondary | ICD-10-CM

## 2022-04-28 LAB — LIPID PANEL
Cholesterol: 149 mg/dL (ref 0–200)
HDL: 67.2 mg/dL (ref >39.00)
LDL Cholesterol: 70 mg/dL (ref 0–99)
NonHDL: 81.92
Total CHOL/HDL Ratio: 2
Triglycerides: 60 mg/dL (ref 0.0–149.0)
VLDL: 12 mg/dL (ref 0.0–40.0)

## 2022-04-28 LAB — COMPREHENSIVE METABOLIC PANEL
ALT: 13 U/L (ref 0–35)
AST: 16 U/L (ref 0–37)
Albumin: 3.9 g/dL (ref 3.5–5.2)
Alkaline Phosphatase: 53 U/L (ref 39–117)
BUN: 12 mg/dL (ref 6–23)
CO2: 26 mEq/L (ref 19–32)
Calcium: 8.9 mg/dL (ref 8.4–10.5)
Chloride: 106 mEq/L (ref 96–112)
Creatinine, Ser: 0.86 mg/dL (ref 0.40–1.20)
GFR: 86.02 mL/min (ref >60.00)
Glucose, Bld: 91 mg/dL (ref 70–99)
Potassium: 4 mEq/L (ref 3.5–5.1)
Sodium: 140 mEq/L (ref 135–145)
Total Bilirubin: 0.6 mg/dL (ref 0.2–1.2)
Total Protein: 6.7 g/dL (ref 6.0–8.3)

## 2022-04-28 LAB — CBC WITH DIFFERENTIAL/PLATELET
Basophils Absolute: 0 10*3/uL (ref 0.0–0.1)
Basophils Relative: 0.6 % (ref 0.0–3.0)
Eosinophils Absolute: 0 10*3/uL (ref 0.0–0.7)
Eosinophils Relative: 1.2 % (ref 0.0–5.0)
HCT: 37.8 % (ref 36.0–46.0)
Hemoglobin: 12.5 g/dL (ref 12.0–15.0)
Lymphocytes Relative: 46.5 % — ABNORMAL HIGH (ref 12.0–46.0)
Lymphs Abs: 1.9 10*3/uL (ref 0.7–4.0)
MCHC: 33 g/dL (ref 30.0–36.0)
MCV: 91.6 fl (ref 78.0–100.0)
Monocytes Absolute: 0.6 10*3/uL (ref 0.1–1.0)
Monocytes Relative: 13.3 % — ABNORMAL HIGH (ref 3.0–12.0)
Neutro Abs: 1.6 10*3/uL (ref 1.4–7.7)
Neutrophils Relative %: 38.4 % — ABNORMAL LOW (ref 43.0–77.0)
Platelets: 213 10*3/uL (ref 150.0–400.0)
RBC: 4.13 Mil/uL (ref 3.87–5.11)
RDW: 15.2 % (ref 11.5–15.5)
WBC: 4.2 10*3/uL (ref 4.0–10.5)

## 2022-04-28 LAB — VITAMIN D 25 HYDROXY (VIT D DEFICIENCY, FRACTURES): VITD: 12.55 ng/mL — ABNORMAL LOW (ref 30.00–100.00)

## 2022-04-28 LAB — HEMOGLOBIN A1C: Hgb A1c MFr Bld: 5.5 % (ref 4.6–6.5)

## 2022-04-28 LAB — TSH: TSH: 2.31 u[IU]/mL (ref 0.35–5.50)

## 2022-04-28 LAB — VITAMIN B12: Vitamin B-12: 140 pg/mL — ABNORMAL LOW (ref 211–911)

## 2022-04-28 MED ORDER — VITAMIN D (ERGOCALCIFEROL) 1.25 MG (50000 UNIT) PO CAPS: 50000.0000 [IU] | ORAL_CAPSULE | ORAL | 0 refills | Status: DC

## 2022-04-28 NOTE — Addendum Note (Signed)
Addended by: Rodman Pickle A on: 04/28/2022 03:39 PM ? ? Modules accepted: Orders ? ?

## 2022-05-06 LAB — RESULTS CONSOLE HPV: CHL HPV: NEGATIVE

## 2022-05-06 LAB — HM PAP SMEAR

## 2022-05-19 ENCOUNTER — Other Ambulatory Visit: Payer: Self-pay | Admitting: Nurse Practitioner

## 2022-05-22 NOTE — Telephone Encounter (Signed)
Chart supports rx refill Last ov: 04/25/22 Last refill: 04/25/22

## 2022-05-23 NOTE — Progress Notes (Unsigned)
   Established Patient Office Visit  Subjective   Patient ID: Julia Davis, female    DOB: 1986/04/17  Age: 36 y.o. MRN: [id number]  No chief complaint on file.   HPI  Julia Davis is here today to follow-up on depression. Last visit she was started on sertraline 50mg  daily  {History (Optional):23778}  ROS    Objective:     There were no vitals taken for this visit. {Vitals History (Optional):23777}  Physical Exam   No results found for any visits on 05/24/22.  {Labs (Optional):23779}  The ASCVD Risk score (Arnett DK, et al., 2019) failed to calculate for the following reasons:   The 2019 ASCVD risk score is only valid for ages 76 to 6    Assessment & Plan:   Problem List Items Addressed This Visit   None   No follow-ups on file.    Charyl Dancer, NP

## 2022-05-24 ENCOUNTER — Ambulatory Visit (INDEPENDENT_AMBULATORY_CARE_PROVIDER_SITE_OTHER): Payer: Medicaid Other | Admitting: Nurse Practitioner

## 2022-05-24 ENCOUNTER — Encounter: Payer: Self-pay | Admitting: Nurse Practitioner

## 2022-05-24 VITALS — BP 151/96 | HR 75 | Temp 97.0°F | Wt 302.4 lb

## 2022-05-24 DIAGNOSIS — F320 Major depressive disorder, single episode, mild: Secondary | ICD-10-CM

## 2022-05-24 DIAGNOSIS — H00014 Hordeolum externum left upper eyelid: Secondary | ICD-10-CM

## 2022-05-24 MED ORDER — ERYTHROMYCIN 5 MG/GM OP OINT: 1.0000 "application " | TOPICAL_OINTMENT | Freq: Three times a day (TID) | OPHTHALMIC | 0 refills | Status: DC

## 2022-05-24 NOTE — Assessment & Plan Note (Signed)
Chronic, not changed.  She has not been taking her sertraline regularly.  Encouraged her to start taking it every day discussing that this needs to be done in order to feel any improvement from the medication.  Could consider switching to fluoxetine with a longer half-life, however will give her 4-6 more weeks on the Zoloft to see how it does.  She is not having any side effects.  PHQ-9 is a 7 and GAD-7 is a 5.  She is not interested in therapy at this point because her insurance does not cover it.  Follow-up in 4 to 6 weeks.

## 2022-05-24 NOTE — Patient Instructions (Signed)
It was great to see you!  Start antibiotic ointment 3 times a day to your left eye. Keep doing warm compresses throughout the day.  Start taking your zoloft every day.   Start over the counter vitamin B12 supplement 1,052mcg daily.   Let's follow-up in 4-6 weeks, sooner if you have concerns.  If a referral was placed today, you will be contacted for an appointment. Please note that routine referrals can sometimes take up to 3-4 weeks to process. Please call our office if you haven't heard anything after this time frame.  Take care,  Rodman Pickle, NP

## 2022-05-24 NOTE — Assessment & Plan Note (Signed)
She has had a stye on her left upper eye for the past 2 months.  She has been using over-the-counter treatment.  We will have her start erythromycin ointment 3 times a day and do warm compresses 3-5 times a day.  If it is not improving, will refer to ophthalmology.

## 2022-06-28 ENCOUNTER — Ambulatory Visit: Payer: Medicaid Other | Admitting: Nurse Practitioner

## 2022-07-11 NOTE — Progress Notes (Deleted)
   Established Patient Office Visit  Subjective   Patient ID: Julia Davis, female    DOB: 03-10-1986  Age: 36 y.o. MRN: [id number]  No chief complaint on file.   HPI  Julia Davis is here to follow up on depression. Currently she is taking sertraline 50mg  daily.   {History (Optional):23778}  ROS    Objective:     There were no vitals taken for this visit. {Vitals History (Optional):23777}  Physical Exam   No results found for any visits on 07/12/22.  {Labs (Optional):23779}  The ASCVD Risk score (Arnett DK, et al., 2019) failed to calculate for the following reasons:   The 2019 ASCVD risk score is only valid for ages 64 to 72    Assessment & Plan:   Problem List Items Addressed This Visit   None   No follow-ups on file.    76, NP

## 2022-07-12 ENCOUNTER — Ambulatory Visit: Payer: Medicaid Other | Admitting: Nurse Practitioner

## 2022-07-13 ENCOUNTER — Telehealth: Payer: Self-pay | Admitting: Nurse Practitioner

## 2022-07-13 NOTE — Telephone Encounter (Signed)
NS letter sent.

## 2022-07-19 ENCOUNTER — Telehealth: Payer: Self-pay | Admitting: Nurse Practitioner

## 2022-07-19 NOTE — Telephone Encounter (Signed)
Caller Name: Deona  Call back phone #: [phone]  MEDICATION(S): omeprazole 40mg   Days of Med Remaining: 1  Has the patient contacted their pharmacy (YES/NO)? n What did pharmacy advise?  Preferred Pharmacy:   ~~~Please advise patient/caregiver to allow 2-3 business days to process RX refills.  Pt stated she had this medication with last pcp but [facility] is her new pcp and pt usually take med everyday.

## 2022-07-20 MED ORDER — PANTOPRAZOLE SODIUM 40 MG PO TBEC: 40.0000 mg | DELAYED_RELEASE_TABLET | Freq: Every day | ORAL | 1 refills | Status: DC

## 2022-09-27 ENCOUNTER — Encounter (HOSPITAL_COMMUNITY): Payer: Self-pay | Admitting: *Deleted

## 2023-02-01 ENCOUNTER — Telehealth: Payer: Medicaid Other | Admitting: Physician Assistant

## 2023-02-01 ENCOUNTER — Telehealth: Payer: Self-pay

## 2023-02-01 DIAGNOSIS — J019 Acute sinusitis, unspecified: Secondary | ICD-10-CM

## 2023-02-01 DIAGNOSIS — B9689 Other specified bacterial agents as the cause of diseases classified elsewhere: Secondary | ICD-10-CM

## 2023-02-01 MED ORDER — BENZONATATE 100 MG PO CAPS: 100.0000 mg | ORAL_CAPSULE | Freq: Three times a day (TID) | ORAL | 0 refills | Status: DC | PRN

## 2023-02-01 MED ORDER — AMOXICILLIN-POT CLAVULANATE 875-125 MG PO TABS: 1.0000 | ORAL_TABLET | Freq: Two times a day (BID) | ORAL | 0 refills | Status: DC

## 2023-02-01 MED ORDER — FLUTICASONE PROPIONATE 50 MCG/ACT NA SUSP: 2.0000 | Freq: Every day | NASAL | 0 refills | Status: DC

## 2023-02-01 NOTE — Progress Notes (Signed)
Virtual Visit Consent   [name], you are scheduled for a virtual visit with a [facility] provider today. Just as with appointments in the office, your consent must be obtained to participate. Your consent will be active for this visit and any virtual visit you may have with one of our providers in the next 365 days. If you have a MyChart account, a copy of this consent can be sent to you electronically.  As this is a virtual visit, video technology does not allow for your provider to perform a traditional examination. This may limit your provider's ability to fully assess your condition. If your provider identifies any concerns that need to be evaluated in person or the need to arrange testing (such as labs, EKG, etc.), we will make arrangements to do so. Although advances in technology are sophisticated, we cannot ensure that it will always work on either your end or our end. If the connection with a video visit is poor, the visit may have to be switched to a telephone visit. With either a video or telephone visit, we are not always able to ensure that we have a secure connection.  By engaging in this virtual visit, you consent to the provision of healthcare and authorize for your insurance to be billed (if applicable) for the services provided during this visit. Depending on your insurance coverage, you may receive a charge related to this service.  I need to obtain your verbal consent now. Are you willing to proceed with your visit today? Julia Davis has provided verbal consent on 02/01/2023 for a virtual visit (video or telephone). Leeanne Rio, [place]  Date: 02/01/2023 3:44 PM  Virtual Visit via Video Note   I, Leeanne Rio, connected with  Julia Davis  (SN:[id number], 1986/06/04) on 02/01/23 at  3:45 PM EST by a video-enabled telemedicine application and verified that I am speaking with the correct person using two identifiers.  Location: Patient: Virtual Visit  Location Patient: Home Provider: Virtual Visit Location Provider: Home Office   I discussed the limitations of evaluation and management by telemedicine and the availability of in person appointments. The patient expressed understanding and agreed to proceed.    History of Present Illness: Julia Davis is a 37 y.o. who identifies as a female who was assigned female at birth, and is being seen today for URI symptoms starting last Friday with sore throat, dry cough. Since then, symptoms have continued to progress pretty significantly. Chills starting Sunday that have resolved. Notes chest congestion but cough is usually dry, occasionally productive in the mornings. Notes cough is persistent and worse at night. Substantial sinus pressure and congestion. Notes bilateral ear pressure and now with R ear pain. Denies tooth pain but notes sinus pain. Covid tested x 2 and both negative.   OTC -- Theraflu.   HPI: HPI  Problems:  Patient Active Problem List   Diagnosis Date Noted   Hordeolum externum of left upper eyelid 05/24/2022   Depression, major, single episode, mild ([place]) 04/25/2022   History of gestational diabetes 04/25/2022   Gastroesophageal reflux disease 04/25/2022   Tobacco abuse 04/25/2022   Alcohol abuse 04/25/2022   Rash 04/25/2022   S/P laparoscopic sleeve gastrectomy 03/08/2020   History of laparoscopic adjustable gastric banding-[facility] in [place] 2013 03/07/2020   Morbid obesity ([place]) 02/27/2020    Allergies: No Known Allergies Medications:  Current Outpatient Medications:    amoxicillin-clavulanate (AUGMENTIN) 875-125 MG tablet, Take 1 tablet by mouth 2 (two) times daily., Disp:  14 tablet, Rfl: 0   benzonatate (TESSALON) 100 MG capsule, Take 1 capsule (100 mg total) by mouth 3 (three) times daily as needed for cough., Disp: 30 capsule, Rfl: 0   fluticasone (FLONASE) 50 MCG/ACT nasal spray, Place 2 sprays into both nostrils daily., Disp: 16 g, Rfl: 0   ibuprofen (ADVIL) 200  MG tablet, Take 600 mg by mouth every 8 (eight) hours as needed (for pain.)., Disp: , Rfl:    levonorgestrel (MIRENA) 20 MCG/24HR IUD, 1 each by Intrauterine route once., Disp: , Rfl:    pantoprazole (PROTONIX) 40 MG tablet, Take 1 tablet (40 mg total) by mouth daily., Disp: 90 tablet, Rfl: 1   Vitamin D, Ergocalciferol, (DRISDOL) 1.25 MG (50000 UNIT) CAPS capsule, Take 1 capsule (50,000 Units total) by mouth every 7 (seven) days., Disp: 12 capsule, Rfl: 0  Observations/Objective: Patient is well-developed, well-nourished in no acute distress.  Resting comfortably at home.  Head is normocephalic, atraumatic.  No labored breathing. Speech is clear and coherent with logical content.  Patient is alert and oriented at baseline.   Assessment and Plan: 1. Acute bacterial sinusitis - fluticasone (FLONASE) 50 MCG/ACT nasal spray; Place 2 sprays into both nostrils daily.  Dispense: 16 g; Refill: 0 - benzonatate (TESSALON) 100 MG capsule; Take 1 capsule (100 mg total) by mouth 3 (three) times daily as needed for cough.  Dispense: 30 capsule; Refill: 0 - amoxicillin-clavulanate (AUGMENTIN) 875-125 MG tablet; Take 1 tablet by mouth 2 (two) times daily.  Dispense: 14 tablet; Refill: 0  Rx Augmentin.  Increase fluids.  Rest.  Saline nasal spray.  Probiotic.  Mucinex as directed.  Humidifier in bedroom. Tessalon and Flonase per orders. OTC medications discussed.  Call or return to clinic if symptoms are not improving.   Follow Up Instructions: I discussed the assessment and treatment plan with the patient. The patient was provided an opportunity to ask questions and all were answered. The patient agreed with the plan and demonstrated an understanding of the instructions.  A copy of instructions were sent to the patient via MyChart unless otherwise noted below.   The patient was advised to call back or seek an in-person evaluation if the symptoms worsen or if the condition fails to improve as  anticipated.  Time:  I spent 10 minutes with the patient via telehealth technology discussing the above problems/concerns.    Leeanne Rio, PA-C

## 2023-02-01 NOTE — Patient Instructions (Addendum)
Julia Davis, thank you for joining Leeanne Rio, PA-C for today's virtual visit.  While this provider is not your primary care provider (PCP), if your PCP is located in our provider database this encounter information will be shared with them immediately following your visit.   [facility] account gives you access to today's visit and all your visits, tests, and labs performed at [facility] " click here if you don't have a [facility] account or go to [email or web address]  Consent: (Patient) Julia Davis provided verbal consent for this virtual visit at the beginning of the encounter.  Current Medications:  Current Outpatient Medications:    clotrimazole-betamethasone (LOTRISONE) cream, Apply 1 application. topically daily., Disp: 30 g, Rfl: 0   erythromycin ophthalmic ointment, Place 1 application. into the left eye 3 (three) times daily., Disp: 21 g, Rfl: 0   fluconazole (DIFLUCAN) 150 MG tablet, Take by mouth., Disp: , Rfl:    ibuprofen (ADVIL) 200 MG tablet, Take 600 mg by mouth every 8 (eight) hours as needed (for pain.)., Disp: , Rfl:    levonorgestrel (MIRENA) 20 MCG/24HR IUD, 1 each by Intrauterine route once., Disp: , Rfl:    metroNIDAZOLE (FLAGYL) 500 MG tablet, Take 500 mg by mouth 2 (two) times daily., Disp: , Rfl:    ondansetron (ZOFRAN-ODT) 4 MG disintegrating tablet, Take 1 tablet (4 mg total) by mouth every 6 (six) hours as needed for nausea or vomiting., Disp: 20 tablet, Rfl: 0   pantoprazole (PROTONIX) 40 MG tablet, Take 1 tablet (40 mg total) by mouth daily., Disp: 90 tablet, Rfl: 1   sertraline (ZOLOFT) 50 MG tablet, TAKE 1 TABLET BY MOUTH EVERY DAY, Disp: 90 tablet, Rfl: 0   Vitamin D, Ergocalciferol, (DRISDOL) 1.25 MG (50000 UNIT) CAPS capsule, Take 1 capsule (50,000 Units total) by mouth every 7 (seven) days., Disp: 12 capsule, Rfl: 0   Medications ordered in this encounter:  No orders of the defined types were placed  in this encounter.    *If you need refills on other medications prior to your next appointment, please contact your pharmacy*  Follow-Up: Call back or seek an in-person evaluation if the symptoms worsen or if the condition fails to improve as anticipated.  [place] [phone]  Other Instructions Please take antibiotic as directed.  Increase fluid intake.  Use Saline nasal spray.  Take a daily multivitamin. Use the Flonase and Tessalon as directed.  Place a humidifier in the bedroom.  Please call or return clinic if symptoms are not improving.  Sinusitis Sinusitis is redness, soreness, and swelling (inflammation) of the paranasal sinuses. Paranasal sinuses are air pockets within the bones of your face (beneath the eyes, the middle of the forehead, or above the eyes). In healthy paranasal sinuses, mucus is able to drain out, and air is able to circulate through them by way of your nose. However, when your paranasal sinuses are inflamed, mucus and air can become trapped. This can allow bacteria and other germs to grow and cause infection. Sinusitis can develop quickly and last only a short time (acute) or continue over a long period (chronic). Sinusitis that lasts for more than 12 weeks is considered chronic.  CAUSES  Causes of sinusitis include: Allergies. Structural abnormalities, such as displacement of the cartilage that separates your nostrils (deviated septum), which can decrease the air flow through your nose and sinuses and affect sinus drainage. Functional abnormalities, such as when the small hairs (cilia) that line your  sinuses and help remove mucus do not work properly or are not present. SYMPTOMS  Symptoms of acute and chronic sinusitis are the same. The primary symptoms are pain and pressure around the affected sinuses. Other symptoms include: Upper toothache. Earache. Headache. Bad breath. Decreased sense of smell and taste. A cough, which worsens when you  are lying flat. Fatigue. Fever. Thick drainage from your nose, which often is green and may contain pus (purulent). Swelling and warmth over the affected sinuses. DIAGNOSIS  Your caregiver will perform a physical exam. During the exam, your caregiver may: Look in your nose for signs of abnormal growths in your nostrils (nasal polyps). Tap over the affected sinus to check for signs of infection. View the inside of your sinuses (endoscopy) with a special imaging device with a light attached (endoscope), which is inserted into your sinuses. If your caregiver suspects that you have chronic sinusitis, one or more of the following tests may be recommended: Allergy tests. Nasal culture A sample of mucus is taken from your nose and sent to a lab and screened for bacteria. Nasal cytology A sample of mucus is taken from your nose and examined by your caregiver to determine if your sinusitis is related to an allergy. TREATMENT  Most cases of acute sinusitis are related to a viral infection and will resolve on their own within 10 days. Sometimes medicines are prescribed to help relieve symptoms (pain medicine, decongestants, nasal steroid sprays, or saline sprays).  However, for sinusitis related to a bacterial infection, your caregiver will prescribe antibiotic medicines. These are medicines that will help kill the bacteria causing the infection.  Rarely, sinusitis is caused by a fungal infection. In theses cases, your caregiver will prescribe antifungal medicine. For some cases of chronic sinusitis, surgery is needed. Generally, these are cases in which sinusitis recurs more than 3 times per year, despite other treatments. HOME CARE INSTRUCTIONS  Drink plenty of water. Water helps thin the mucus so your sinuses can drain more easily. Use a humidifier. Inhale steam 3 to 4 times a day (for example, sit in the bathroom with the shower running). Apply a warm, moist washcloth to your face 3 to 4 times a day,  or as directed by your caregiver. Use saline nasal sprays to help moisten and clean your sinuses. Take over-the-counter or prescription medicines for pain, discomfort, or fever only as directed by your caregiver. SEEK IMMEDIATE MEDICAL CARE IF: You have increasing pain or severe headaches. You have nausea, vomiting, or drowsiness. You have swelling around your face. You have vision problems. You have a stiff neck. You have difficulty breathing. MAKE SURE YOU:  Understand these instructions. Will watch your condition. Will get help right away if you are not doing well or get worse. Document Released: 12/04/2005 Document Revised: 02/26/2012 Document Reviewed: 12/19/2011 [facility] Patient Information 2014 [place], [place].    If you have been instructed to have an in-person evaluation today at a local Urgent Care facility, please use the link below. It will take you to a list of all of our available [facility], including address, phone number and hours of operation. Please do not delay care.  [facility]  If you or a family member do not have a primary care provider, use the link below to schedule a visit and establish care. When you choose a [facility] primary care physician or advanced practice provider, you gain a long-term partner in health. Find a Primary Care Provider  Learn  more about [facility]'s in-office and virtual care options: [facility]

## 2023-09-20 ENCOUNTER — Emergency Department (HOSPITAL_COMMUNITY)
Admission: EM | Admit: 2023-09-20 | Discharge: 2023-09-20 | Disposition: A | Discharge status: Home / Self Care | Payer: Medicaid Other

## 2023-09-20 ENCOUNTER — Other Ambulatory Visit: Payer: Self-pay

## 2023-09-20 ENCOUNTER — Emergency Department (HOSPITAL_COMMUNITY): Payer: Medicaid Other

## 2023-09-20 ENCOUNTER — Encounter (HOSPITAL_COMMUNITY): Payer: Self-pay | Admitting: *Deleted

## 2023-09-20 ENCOUNTER — Ambulatory Visit
Admission: EM | Admit: 2023-09-20 | Discharge: 2023-09-20 | Disposition: A | Discharge status: Home / Self Care | Payer: Medicaid Other

## 2023-09-20 DIAGNOSIS — R079 Chest pain, unspecified: Secondary | ICD-10-CM

## 2023-09-20 DIAGNOSIS — Z87891 Personal history of nicotine dependence: Secondary | ICD-10-CM | POA: Diagnosis not present

## 2023-09-20 DIAGNOSIS — R0602 Shortness of breath: Secondary | ICD-10-CM | POA: Insufficient documentation

## 2023-09-20 DIAGNOSIS — D649 Anemia, unspecified: Secondary | ICD-10-CM | POA: Insufficient documentation

## 2023-09-20 LAB — BASIC METABOLIC PANEL
Anion gap: 11 (ref 5–15)
BUN: 13 mg/dL (ref 6–20)
CO2: 24 mmol/L (ref 22–32)
Calcium: 8.9 mg/dL (ref 8.9–10.3)
Chloride: 102 mmol/L (ref 98–111)
Creatinine, Ser: 0.97 mg/dL (ref 0.44–1.00)
GFR, Estimated: >60 mL/min (ref >60)
Glucose, Bld: 102 mg/dL — ABNORMAL HIGH (ref 70–99)
Potassium: 3.5 mmol/L (ref 3.5–5.1)
Sodium: 137 mmol/L (ref 135–145)

## 2023-09-20 LAB — CBC
HCT: 32.8 % — ABNORMAL LOW (ref 36.0–46.0)
Hemoglobin: 9.9 g/dL — ABNORMAL LOW (ref 12.0–15.0)
MCH: 25.7 pg — ABNORMAL LOW (ref 26.0–34.0)
MCHC: 30.2 g/dL (ref 30.0–36.0)
MCV: 85.2 fL (ref 80.0–100.0)
Platelets: 241 10*3/uL (ref 150–400)
RBC: 3.85 MIL/uL — ABNORMAL LOW (ref 3.87–5.11)
RDW: 18.7 % — ABNORMAL HIGH (ref 11.5–15.5)
WBC: 5 10*3/uL (ref 4.0–10.5)
nRBC: 0 % (ref 0.0–0.2)

## 2023-09-20 LAB — HCG, SERUM, QUALITATIVE: Preg, Serum: NEGATIVE

## 2023-09-20 LAB — TROPONIN I (HIGH SENSITIVITY)
Troponin I (High Sensitivity): 3 ng/L (ref <18)
Troponin I (High Sensitivity): 3 ng/L (ref <18)

## 2023-09-20 MED ORDER — ALUM & MAG HYDROXIDE-SIMETH 200-200-20 MG/5ML PO SUSP
30.0000 mL | Freq: Once | ORAL | Status: AC
  Administered 2023-09-20: 30 mL via ORAL
  Filled 2023-09-20: qty 30

## 2023-09-20 NOTE — ED Provider Notes (Signed)
Patient here today for evaluation of chest pain.  She reports symptoms have been intermittent over the last 2 weeks.  She notes that pain will shoot through her chest into her back and last about 5 minutes.  She is currently having pain and reports some shortness of breath.  She denies any nausea.  Recommended further evaluation in the emergency room.  EKG in office with normal sinus rhythm but discussed need for labs to fully rule out cardiac etiology.  Patient is agreeable to report to ED and will transport via POV.    Tomi Bamberger, PA-C 09/20/23 1300

## 2023-09-20 NOTE — Discharge Instructions (Signed)
Please report to ED after leaving our office.

## 2023-09-20 NOTE — ED Triage Notes (Signed)
Sent from UC due to Central chest pain on and off for 2 weeks, SHOB reported with pain. Nothing makes it better or worse

## 2023-09-20 NOTE — ED Provider Notes (Signed)
[place] EMERGENCY DEPARTMENT AT [facility] Provider Note   CSN: [id number] Arrival date & time: 09/20/23  1323     History  Chief Complaint  Patient presents with   Chest Pain   Shortness of Breath    Julia Davis is a 37 y.o. female.  37 year old female with past medical history of tobacco abuse in the past as well as BMI greater than 30 presenting to the emergency department today with chest pain.  The patient states that this has been going on and off now for the past 2 weeks.  She states that the pain is more of a tightness in the center of her chest.  She states it does occasionally radiate to her back initially.  She denies any radiation currently.  She states that when it does, usually last for 5 to 10 minutes.  She denies any associated nausea or diaphoresis.  Denies any recent fevers, chills, or cough.  The patient denies a history of DVT or pulmonary embolism, recent surgeries, recent travel.  She is not on any estrogen-containing medications orally.  She came to the emergency department today after going to urgent care initially and she was sent here for further evaluation.   Chest Pain Associated symptoms: shortness of breath   Shortness of Breath Associated symptoms: chest pain        Home Medications Prior to Admission medications   Medication Sig Start Date End Date Taking? Authorizing Provider  amoxicillin-clavulanate (AUGMENTIN) 875-125 MG tablet Take 1 tablet by mouth 2 (two) times daily. 02/01/23   Waldon Merl, PA-C  amoxicillin-clavulanate (AUGMENTIN) 875-125 MG tablet Take 1 tablet by mouth 2 (two) times daily.    [provider]  benzonatate (TESSALON) 100 MG capsule Take 1 capsule (100 mg total) by mouth 3 (three) times daily as needed for cough. 02/01/23   Waldon Merl, PA-C  Ergocalciferol (VITAMIN D2 PO) Take 1 capsule by mouth every 7 (seven) days.    [provider]  fluticasone (FLONASE) 50 MCG/ACT nasal  spray Place 2 sprays into both nostrils daily. 02/01/23   Waldon Merl, PA-C  ibuprofen (ADVIL) 200 MG tablet Take 600 mg by mouth every 8 (eight) hours as needed (for pain.).    [provider]  levonorgestrel (MIRENA) 20 MCG/24HR IUD 1 each by Intrauterine route once.    [provider]  levonorgestrel (MIRENA, 52 MG,) 20 MCG/DAY IUD 1 each by Intrauterine route once. 03/27/19   [provider]  metroNIDAZOLE (FLAGYL) 250 MG tablet Take 250 mg by mouth 3 (three) times daily. 03/27/19   [provider]  metroNIDAZOLE (METROGEL) 0.75 % vaginal gel Place 1 Applicatorful vaginally at bedtime.    [provider]  nicotine (NICODERM CQ - DOSED IN MG/24 HOURS) 14 mg/24hr patch Place 14 mg onto the skin daily.    [provider]  norethindrone (AYGESTIN) 5 MG tablet Take 5 mg by mouth daily.    [provider]  omeprazole (PRILOSEC) 20 MG capsule Take 20 mg by mouth daily.    [provider]  pantoprazole (PROTONIX) 40 MG tablet Take 1 tablet (40 mg total) by mouth daily. 07/20/22   McElwee, Lauren A, NP  pantoprazole (PROTONIX) 40 MG tablet Take 1 tablet by mouth daily.    [provider]  sertraline (ZOLOFT) 50 MG tablet Take 1 tablet by mouth daily.    [provider]  Vitamin D, Ergocalciferol, (DRISDOL) 1.25 MG (50000 UNIT) CAPS capsule Take 1  capsule (50,000 Units total) by mouth every 7 (seven) days. 04/28/22   McElwee, Jake Church, NP      Allergies    Patient has no known allergies.    Review of Systems   Review of Systems  Respiratory:  Positive for shortness of breath.   Cardiovascular:  Positive for chest pain.  All other systems reviewed and are negative.   Physical Exam Updated Vital Signs BP (!) 170/106   Pulse 63   Temp 99.3 F (37.4 C) (Oral)   Resp 17   Ht 5\' 9"  (1.753 m)   Wt (!) 145.2 kg   LMP 09/17/2023 (Exact Date)   SpO2 100%   BMI 47.26 kg/m  Physical Exam Vitals and nursing  note reviewed.   Gen: NAD Eyes: PERRL, EOMI HEENT: no oropharyngeal swelling Neck: trachea midline Resp: clear to auscultation bilaterally Card: RRR, no murmurs, rubs, or gallops Abd: nontender, nondistended Extremities: no calf tenderness, no edema Vascular: 2+ radial pulses bilaterally, 2+ DP pulses bilaterally Neuro: No focal deficits Skin: no rashes Psyc: acting appropriately   ED Results / Procedures / Treatments   Labs (all labs ordered are listed, but only abnormal results are displayed) Labs Reviewed  BASIC METABOLIC PANEL - Abnormal; Notable for the following components:      Result Value   Glucose, Bld 102 (*)    All other components within normal limits  CBC - Abnormal; Notable for the following components:   RBC 3.85 (*)    Hemoglobin 9.9 (*)    HCT 32.8 (*)    MCH 25.7 (*)    RDW 18.7 (*)    All other components within normal limits  HCG, SERUM, QUALITATIVE  TROPONIN I (HIGH SENSITIVITY)  TROPONIN I (HIGH SENSITIVITY)    EKG None  Radiology DG Chest 2 View  Result Date: 09/20/2023 CLINICAL DATA:  Chest pain. EXAM: CHEST - 2 VIEW COMPARISON:  03/02/2020 FINDINGS: The cardiomediastinal contours are normal. Chronic eventration of right hemidiaphragm. Pulmonary vasculature is normal. No consolidation, pleural effusion, or pneumothorax. No acute osseous abnormalities are seen. IMPRESSION: No active cardiopulmonary disease. Electronically Signed   By: Narda Rutherford M.D.   On: 09/20/2023 15:59    Procedures Procedures    Medications Ordered in ED Medications  alum & mag hydroxide-simeth (MAALOX/MYLANTA) 200-200-20 MG/5ML suspension 30 mL (30 mLs Oral Given 09/20/23 1639)    ED Course/ Medical Decision Making/ A&P                                 Medical Decision Making 37 year old female with past medical history of tobacco abuse and BMI greater than 30 presenting to the emergency department today with intermittent chest pain over the past 2 weeks.  I  will further evaluate the patient here with basic labs as well as an EKG, chest x-ray, and troponin for further evaluation for ACS, pulmonary edema, pulmonary infiltrates, pneumothorax.  Given the reassuring neurovascular exam and duration of symptoms and the intermittent character suspicion for aortic dissection is low at this time.  The patient is PERC negative and suspicion for pulmonary embolism is low.  Her EKG interpreted by me shows a sinus rhythm with normal axis, normal intervals, no significant ST-T changes.  In the event that this is due to GERD or gastritis I will give patient GI cocktail here to see if this helps with her symptoms.  The patient's workup here was reassuring.  2  troponins are negative.  Heart score is low.  She is referred back to her primary care provider.  She left the department before I could go over her results with her.  I attempted to call the patient but she did not answer.  Amount and/or Complexity of Data Reviewed Labs: ordered. Radiology: ordered.  Risk OTC drugs.           Final Clinical Impression(s) / ED Diagnoses Final diagnoses:  Chest pain, unspecified type  Anemia, unspecified type    Rx / DC Orders ED Discharge Orders     None         Durwin Glaze, MD 09/20/23 1805

## 2023-09-20 NOTE — ED Triage Notes (Signed)
"  This chest pain comes and goes, first time was 2 wks ago, it shot through my chest and back, lasting , started again (and still hurts now). Some sob. No nausea.

## 2023-09-20 NOTE — ED Notes (Signed)
Patient is being discharged from the Urgent Care and sent to the Emergency Department via POV . Per REBECCA MYERS, PA-C, patient is in need of higher level of care due to CHEST PAIN. Patient is aware and verbalizes understanding of plan of care.  Vitals:   09/20/23 1229  BP: (!) 133/93  Pulse: 74  Resp: 20  Temp: 98.5 F (36.9 C)  SpO2: 100%

## 2023-09-20 NOTE — Discharge Instructions (Addendum)
Your workup today was reassuring.  Please follow-up with your doctor for reevaluation.  Her blood counts were little low.  Please follow-up to have this rechecked.  Return to the ER for worsening symptoms.

## 2023-10-03 ENCOUNTER — Encounter (HOSPITAL_COMMUNITY): Payer: Self-pay | Admitting: *Deleted

## 2023-10-18 ENCOUNTER — Encounter: Payer: Self-pay | Admitting: Nurse Practitioner

## 2023-10-18 ENCOUNTER — Other Ambulatory Visit (HOSPITAL_COMMUNITY): Payer: Self-pay

## 2023-10-18 ENCOUNTER — Ambulatory Visit (INDEPENDENT_AMBULATORY_CARE_PROVIDER_SITE_OTHER): Payer: No Typology Code available for payment source | Admitting: Nurse Practitioner

## 2023-10-18 ENCOUNTER — Telehealth: Payer: Self-pay | Admitting: Diagnostic Neuroimaging

## 2023-10-18 VITALS — BP 122/80 | HR 72 | Temp 97.1°F | Ht 69.0 in | Wt 328.8 lb

## 2023-10-18 DIAGNOSIS — Z9884 Bariatric surgery status: Secondary | ICD-10-CM

## 2023-10-18 DIAGNOSIS — R030 Elevated blood-pressure reading, without diagnosis of hypertension: Secondary | ICD-10-CM

## 2023-10-18 DIAGNOSIS — D649 Anemia, unspecified: Secondary | ICD-10-CM | POA: Diagnosis not present

## 2023-10-18 DIAGNOSIS — Z8632 Personal history of gestational diabetes: Secondary | ICD-10-CM

## 2023-10-18 DIAGNOSIS — F1010 Alcohol abuse, uncomplicated: Secondary | ICD-10-CM

## 2023-10-18 LAB — CBC WITH DIFFERENTIAL/PLATELET
Basophils Absolute: 0.1 10*3/uL (ref 0.0–0.1)
Basophils Relative: 1.2 % (ref 0.0–3.0)
Eosinophils Absolute: 0 10*3/uL (ref 0.0–0.7)
Eosinophils Relative: 0.5 % (ref 0.0–5.0)
HCT: 30.3 % — ABNORMAL LOW (ref 36.0–46.0)
Hemoglobin: 9.2 g/dL — ABNORMAL LOW (ref 12.0–15.0)
Lymphocytes Relative: 35.8 % (ref 12.0–46.0)
Lymphs Abs: 1.7 10*3/uL (ref 0.7–4.0)
MCHC: 30.2 g/dL (ref 30.0–36.0)
MCV: 81.7 fL (ref 78.0–100.0)
Monocytes Absolute: 0.5 10*3/uL (ref 0.1–1.0)
Monocytes Relative: 11.4 % (ref 3.0–12.0)
Neutro Abs: 2.4 10*3/uL (ref 1.4–7.7)
Neutrophils Relative %: 51.1 % (ref 43.0–77.0)
Platelets: 321 10*3/uL (ref 150.0–400.0)
RBC: 3.71 Mil/uL — ABNORMAL LOW (ref 3.87–5.11)
RDW: 20.7 % — ABNORMAL HIGH (ref 11.5–15.5)
WBC: 4.7 10*3/uL (ref 4.0–10.5)

## 2023-10-18 LAB — COMPREHENSIVE METABOLIC PANEL
ALT: 15 U/L (ref 0–35)
AST: 19 U/L (ref 0–37)
Albumin: 3.9 g/dL (ref 3.5–5.2)
Alkaline Phosphatase: 52 U/L (ref 39–117)
BUN: 13 mg/dL (ref 6–23)
CO2: 28 meq/L (ref 19–32)
Calcium: 9 mg/dL (ref 8.4–10.5)
Chloride: 105 meq/L (ref 96–112)
Creatinine, Ser: 0.86 mg/dL (ref 0.40–1.20)
GFR: 85.13 mL/min (ref >60.00)
Glucose, Bld: 88 mg/dL (ref 70–99)
Potassium: 4 meq/L (ref 3.5–5.1)
Sodium: 142 meq/L (ref 135–145)
Total Bilirubin: 0.5 mg/dL (ref 0.2–1.2)
Total Protein: 7 g/dL (ref 6.0–8.3)

## 2023-10-18 LAB — LIPID PANEL
Cholesterol: 168 mg/dL (ref 0–200)
HDL: 61.7 mg/dL (ref >39.00)
LDL Cholesterol: 93 mg/dL (ref 0–99)
NonHDL: 106.23
Total CHOL/HDL Ratio: 3
Triglycerides: 68 mg/dL (ref 0.0–149.0)
VLDL: 13.6 mg/dL (ref 0.0–40.0)

## 2023-10-18 LAB — VITAMIN D 25 HYDROXY (VIT D DEFICIENCY, FRACTURES): VITD: 12.79 ng/mL — ABNORMAL LOW (ref 30.00–100.00)

## 2023-10-18 LAB — VITAMIN B12: Vitamin B-12: 226 pg/mL (ref 211–911)

## 2023-10-18 LAB — HEMOGLOBIN A1C: Hgb A1c MFr Bld: 6 % (ref 4.6–6.5)

## 2023-10-18 NOTE — Telephone Encounter (Signed)
Received sleep referral for patient from [facility] for eval for OSA. Placed in sleep referrals box

## 2023-10-18 NOTE — Patient Instructions (Signed)
It was great to see you!  Start checking your blood pressure at home.   Watch the amount of salt you are eating   Increase exercise as able.   Check with your insurance and see if they cover contrave, zepbound, or wegovy for weight loss   Let's follow-up in 4 weeks, sooner if you have concerns.  If a referral was placed today, you will be contacted for an appointment. Please note that routine referrals can sometimes take up to 3-4 weeks to process. Please call our office if you haven't heard anything after this time frame.  Take care,  Rodman Pickle, NP

## 2023-10-18 NOTE — Assessment & Plan Note (Signed)
She has cut back to 2 drinks per day, although will sometimes still binge on the weekends. Congratulated her on cutting back, she can still reach out to programs such as AA.

## 2023-10-18 NOTE — Assessment & Plan Note (Signed)
Check CMP, CBC, vitamin B12, vitamin D today.

## 2023-10-18 NOTE — Assessment & Plan Note (Signed)
She has experienced significant weight gain over the past year and expressed interest in weight loss medication. We advise her to check with her insurance regarding coverage for weight loss medications such as Wegovy or Zepbound and discuss the potential use of phentermine if her blood pressure remains stable.

## 2023-10-18 NOTE — Assessment & Plan Note (Signed)
Previous labs indicated slightly low blood counts, and she has reported heavy menstrual bleeding. We will order CBC and iron studies today and consider iron supplementation depending on the results.

## 2023-10-18 NOTE — Assessment & Plan Note (Signed)
Check A1c today.

## 2023-10-18 NOTE — Assessment & Plan Note (Signed)
She has experienced recent episodes of elevated blood pressure accompanied by chest pain and palpitations, though her EKG was normal, and she reports no headaches, dizziness, or significant dyspnea. Troponins were negative in the ER as well. BP normal today at 122/80. We will advise her to monitor her blood pressure at home or at a local pharmacy 1-2 times per week and return in 4 weeks for reassessment. If her blood pressure is consistently high, she should return sooner.

## 2023-10-18 NOTE — Progress Notes (Signed)
Acute Office Visit  Subjective:     Patient ID: Julia Davis, female    DOB: Jan 04, 1986, 37 y.o.   MRN: [id number]  Chief Complaint  Patient presents with   Elevated Blood Pressure    Elevated for 2-3 weeks    HPI Patient is in today for elevated blood pressure yesterday.   Discussed the use of AI scribe software for clinical note transcription with the patient, who gave verbal consent to proceed.  History of Present Illness   The patient presents with recent episodes of elevated blood pressure and chest pain. She reports a hospital visit three weeks ago due to chest pain, where her blood pressure was recorded as 177/107. Subsequent doctor's appointments also showed elevated blood pressure readings. She describes experiencing chest pain and palpitations, but denies headaches and dizziness. She reports shortness of breath when climbing stairs and swelling in her legs, which she attributes to fluid retention.  The patient has recently gained weight, adding 24 pounds over the past year and a half. She reports a change in her alcohol consumption, reducing her intake to two drinks during the week and binge drinking on weekends. She has also started vaping.  The patient also reports heavy menstrual periods, which are being managed by her gynecologist. She has an IUD in place.      ROS See pertinent positives and negatives per HPI.     Objective:    BP 122/80 (BP Location: Left Arm, Cuff Size: Large)   Pulse 72   Temp (!) 97.1 F (36.2 C)   Ht 5\' 9"  (1.753 m)   Wt (!) 328 lb 12.8 oz (149.1 kg)   LMP 10/08/2023 (Exact Date)   SpO2 96%   BMI 48.56 kg/m  BP Readings from Last 3 Encounters:  10/18/23 122/80  09/20/23 (!) 170/106  09/20/23 (!) 133/93   Wt Readings from Last 3 Encounters:  10/18/23 (!) 328 lb 12.8 oz (149.1 kg)  09/20/23 (!) 320 lb (145.2 kg)  09/20/23 (!) 320 lb (145.2 kg)      Physical Exam Vitals and nursing note reviewed.  Constitutional:       General: She is not in acute distress.    Appearance: Normal appearance. She is obese.  HENT:     Head: Normocephalic.  Eyes:     Conjunctiva/sclera: Conjunctivae normal.  Cardiovascular:     Rate and Rhythm: Normal rate and regular rhythm.     Pulses: Normal pulses.     Heart sounds: Normal heart sounds.  Pulmonary:     Effort: Pulmonary effort is normal.     Breath sounds: Normal breath sounds.  Musculoskeletal:     Cervical back: Normal range of motion.  Skin:    General: Skin is warm.  Neurological:     General: No focal deficit present.     Mental Status: She is alert and oriented to person, place, and time.  Psychiatric:        Mood and Affect: Mood normal.        Behavior: Behavior normal.        Thought Content: Thought content normal.        Judgment: Judgment normal.       Assessment & Plan:   Problem List Items Addressed This Visit       Other   Morbid obesity (HCC)    She has experienced significant weight gain over the past year and expressed interest in weight loss medication. We advise her to check with  her insurance regarding coverage for weight loss medications such as Wegovy or Zepbound and discuss the potential use of phentermine if her blood pressure remains stable.       Relevant Orders   Lipid panel   S/P laparoscopic sleeve gastrectomy    Check CMP, CBC, vitamin B12, vitamin D today.       Relevant Orders   CBC with Differential/Platelet   Comprehensive metabolic panel   VITAMIN D 25 Hydroxy (Vit-D Deficiency, Fractures)   History of gestational diabetes    Check A1c today      Relevant Orders   Hemoglobin A1c   Alcohol abuse    She has cut back to 2 drinks per day, although will sometimes still binge on the weekends. Congratulated her on cutting back, she can still reach out to programs such as AA.       Elevated blood pressure reading - Primary    She has experienced recent episodes of elevated blood pressure accompanied by chest  pain and palpitations, though her EKG was normal, and she reports no headaches, dizziness, or significant dyspnea. Troponins were negative in the ER as well. BP normal today at 122/80. We will advise her to monitor her blood pressure at home or at a local pharmacy 1-2 times per week and return in 4 weeks for reassessment. If her blood pressure is consistently high, she should return sooner.      Relevant Orders   CBC with Differential/Platelet   Comprehensive metabolic panel   Anemia    Previous labs indicated slightly low blood counts, and she has reported heavy menstrual bleeding. We will order CBC and iron studies today and consider iron supplementation depending on the results.      Relevant Orders   CBC with Differential/Platelet   Iron, TIBC and Ferritin Panel   Vitamin B12    No orders of the defined types were placed in this encounter.   Return in about 4 weeks (around 11/15/2023) for CPE.  Gerre Scull, NP

## 2023-10-19 ENCOUNTER — Encounter: Payer: Self-pay | Admitting: Nurse Practitioner

## 2023-10-19 LAB — IRON,TIBC AND FERRITIN PANEL
%SAT: 7 % — ABNORMAL LOW (ref 16–45)
Ferritin: 12 ng/mL — ABNORMAL LOW (ref 16–154)
Iron: 32 ug/dL — ABNORMAL LOW (ref 40–190)
TIBC: 433 ug/dL (ref 250–450)

## 2023-10-22 ENCOUNTER — Encounter: Payer: Self-pay | Admitting: Dietician

## 2023-10-22 ENCOUNTER — Encounter: Payer: No Typology Code available for payment source | Attending: General Surgery | Admitting: Dietician

## 2023-10-22 VITALS — Ht 69.0 in | Wt 328.5 lb

## 2023-10-22 DIAGNOSIS — E669 Obesity, unspecified: Secondary | ICD-10-CM

## 2023-10-22 DIAGNOSIS — Z713 Dietary counseling and surveillance: Secondary | ICD-10-CM | POA: Insufficient documentation

## 2023-10-22 DIAGNOSIS — Z6841 Body Mass Index (BMI) 40.0 and over, adult: Secondary | ICD-10-CM | POA: Insufficient documentation

## 2023-10-22 NOTE — Progress Notes (Signed)
Medical Nutrition Therapy  Appointment Start time:  10:13  Appointment End time:  11:08  Primary concerns today: weight gain  Referral diagnosis: obesity; s/p sleeve surgery Preferred learning style: no preference indicated (auditory, visual, hands on, no preference indicated) Learning readiness: ready (not ready, contemplating, ready, change in progress)  NUTRITION ASSESSMENT   Anthropometrics Start weight at [facility]: 318.3 lbs (date: 12/23/2019)   Height: 69 inches Weight today: 328.5 lb   Clinical Medical Hx: sleep apnea, obesity, GERD Medications: omeprazole, vit D, mirena  Labs: vit D 12.79; A1c 6.0; iron 32; RBC 3.71; HCT 30.3; hemoglobin 9.2; RDW 20.7 Notable Signs/Symptoms: none noted  Body Composition Scale 10/22/2023  Current Body Weight 328.5  Total Body Fat % 48.2  Visceral Fat 17  Fat-Free Mass % 51.7   Total Body Water % 40.3  Muscle-Mass lbs 37.1  BMI 48.3  Body Fat Displacement          Torso  lbs 98.4         Left Leg  lbs 19.6         Right Leg  lbs 19.6         Left Arm  lbs 9.8         Right Arm  lbs 9.8   Lifestyle & Dietary Hx  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 Pt states she practices not drinking with meals. Pt states the surgeon would like for pt to be re-tested for sleep apnea, with a sleep test.  Estimated daily fluid intake: 64 oz Supplements: vit D Sleep: pt states she does not use a CPAP machine; uses alcohol daily Stress / self-care: 5 (scale of 1-10) on average, stating she will get overwhelmed at times. Current average weekly physical activity: ADL  24-Hr Dietary Recall First Meal: skip Snack: water Second Meal: salad or salmon  Snack:  Third Meal: wings and brussel sprouts  Snack:  Beverages: water, alcohol (2-3 drinks daily)  Estimated Energy Needs Calories: 1600   NUTRITION DIAGNOSIS  Overweight/obesity (Aneth-3.3) related to past poor dietary habits and physical inactivity as evidenced by completed bariatric  surgery and following dietary guidelines for continued weight loss and healthy nutrition status.   NUTRITION INTERVENTION Nutrition counseling (C-1) and education (E-2) to facilitate bariatric surgery goals, including: The importance of consuming adequate calories as well as certain nutrients daily due to the body's need for essential vitamins, minerals, and fats The importance of daily physical activity and to reach a goal of at least 150 minutes of moderate to vigorous physical activity weekly (or as directed by their physician) due to benefits such as increased musculature and improved lab values The importance of intuitive eating specifically learning hunger-satiety cues and understanding the importance of learning a new body: The importance of mindful eating to avoid grazing behaviors  Encouraged patient to honor their body's internal hunger and fullness cues.  Throughout the day, check in mentally and rate hunger. Stop eating when satisfied not full regardless of how much food is left on the plate.  Get more if still hungry 20-30 minutes later.  The key is to honor satisfaction so throughout the meal, rate fullness factor and stop when comfortably satisfied not physically full. The key is to honor hunger and fullness without any feelings of guilt or shame.  Pay attention to what the internal cues are, rather than any external factors. This will enhance the confidence you have in listening to your own body and following those internal  cues enabling you to increase how often you eat when you are hungry not out of appetite and stop when you are satisfied not full.  Encouraged pt to continue to eat balanced meals inclusive of non starchy vegetables 2 times a day 7 days a week Encouraged pt to choose lean protein sources: limiting beef, pork, sausage, hotdogs, and lunch meat Encourage pt to choose healthy fats such as plant based limiting animal fats Encouraged pt to continue to drink a minium 64 fluid  ounces with half being plain water to satisfy proper hydration   Purpose of hydration: Water makes up over 50% of your total body water, and is part of many organs throughout the body. Water is essential to transport digested nutrients, regulate body temperature, rid the body of waste products, and protects joints and the spinal cord. When not properly hydrated you will begin to experience headaches, cramps and dizziness. Further dehydration can result in rapid heart rate, shock, oliguria, and may cause seizures.  https://www.merckmanuals.com/home/hormonal-and-metabolic-disordehttps://www.usgs.gov/special-topic/water-science-school/science/water-you-water-and-human-body?qt-science_center_objects=0#qt-science_center_objectsrs/water-balance/about-body-water FriendLock.it InvestmentInstructor.com.cy FurEliminator.es https://www.health.BasicFM.no Purpose of protein: Every cell in your body has protein. Protein is essential for the structure, function and regulation of tissues and organs within the body. Without protein enzymes and antibodies would not exist, and cells would lack storage, transportation, and messenger systems. According to [facility]. [employer], the body is made up of at least 10000 different proteins. Lack of protein can lead to growth failure in children, loss of muscle mass, decreased immune system function, and overall weakening of various organs in the body.  SearchEngineCritic.nl, DoubleProperty.com.cy, PokerProtocol.pl   Handouts Provided Include  Maintenance Plan Meal Ideas Protein Foods List with value Health Benefits of Physical Activity  Learning Style &  Readiness for Change Teaching method utilized: Visual & Auditory  Demonstrated degree of understanding via: Teach Back  Barriers to learning/adherence to lifestyle change: nothing identified  Goals Established by Pt Increase physical activity; aim to go to gym after work 3-4 days a week, 60 minutes Decrease alcohol consumption; eliminate drinks during the weekday Aim for 60 grams of protein per day; track your protein intake  MONITORING & EVALUATION Dietary intake, weekly physical activity.  Next Steps  Patient is to return to [facility] in 2 months for follow-up.

## 2023-11-05 ENCOUNTER — Telehealth: Payer: Self-pay | Admitting: Nurse Practitioner

## 2023-11-05 NOTE — Telephone Encounter (Signed)
I read message to Julia Davis and she stated that patient would need to go to the Emergency Room for this as there is a risk of seizures with withdrawals.  I called and spoke with patient and notified her of message.

## 2023-11-05 NOTE — Telephone Encounter (Signed)
Pt want you to call her . Pt want to know if you can prescribe alcohol withdraw medication

## 2023-11-20 ENCOUNTER — Encounter: Payer: Self-pay | Admitting: Nurse Practitioner

## 2023-11-20 ENCOUNTER — Ambulatory Visit (INDEPENDENT_AMBULATORY_CARE_PROVIDER_SITE_OTHER): Payer: No Typology Code available for payment source | Admitting: Nurse Practitioner

## 2023-11-20 VITALS — BP 124/82 | HR 73 | Temp 97.0°F | Ht 69.0 in | Wt 327.0 lb

## 2023-11-20 DIAGNOSIS — Z9884 Bariatric surgery status: Secondary | ICD-10-CM

## 2023-11-20 DIAGNOSIS — D508 Other iron deficiency anemias: Secondary | ICD-10-CM | POA: Diagnosis not present

## 2023-11-20 DIAGNOSIS — R7303 Prediabetes: Secondary | ICD-10-CM | POA: Insufficient documentation

## 2023-11-20 DIAGNOSIS — E559 Vitamin D deficiency, unspecified: Secondary | ICD-10-CM | POA: Insufficient documentation

## 2023-11-20 DIAGNOSIS — Z0000 Encounter for general adult medical examination without abnormal findings: Secondary | ICD-10-CM | POA: Diagnosis not present

## 2023-11-20 MED ORDER — PHENTERMINE HCL 15 MG PO CAPS: 15.0000 mg | ORAL_CAPSULE | ORAL | 0 refills | Status: DC

## 2023-11-20 NOTE — Assessment & Plan Note (Signed)
Continue vitamin D 50,000 units weekly. 

## 2023-11-20 NOTE — Assessment & Plan Note (Signed)
A1c 6%. Discussed nutrition, exercise.

## 2023-11-20 NOTE — Assessment & Plan Note (Signed)
BMI 48.2. Her insurance does not cover weight loss medications and she has a history of bariatric surgery. Discussed options and she would like to start phentermine. Will start phentermine 15mg  daily. Check blood pressure at home. Discussed possible side effects. Follow-up in 4-5 weeks.

## 2023-11-20 NOTE — Assessment & Plan Note (Signed)
Hgb and ferritin slightly low, she did not start an iron supplement yet. Recommend she start iron 65mg  daily with food.

## 2023-11-20 NOTE — Progress Notes (Signed)
BP 124/82 (BP Location: Right Arm, Cuff Size: Large)   Pulse 73   Temp (!) 97 F (36.1 C)   Ht 5\' 9"  (1.753 m)   Wt (!) 327 lb (148.3 kg)   LMP 11/09/2023 (Exact Date)   SpO2 100%   BMI 48.29 kg/m    Subjective:    Patient ID: Julia Davis, female    DOB: 23-Nov-1986, 37 y.o.   MRN: [id number]  CC: Chief Complaint  Patient presents with   Annual Exam    No concerns    HPI: Julia Davis is a 37 y.o. female presenting on 11/20/2023 for comprehensive medical examination. Current medical complaints include:none  She currently lives with: children Menopausal Symptoms: no  Depression and Anxiety Screen done today and results listed below:     11/20/2023    8:43 AM 10/18/2023    8:19 AM 05/24/2022    9:07 AM 04/25/2022   10:58 AM  Depression screen PHQ 2/9  Decreased Interest 1 1 1 1   Down, Depressed, Hopeless 1 1 1 1   PHQ - 2 Score 2 2 2 2   Altered sleeping 1 1 1 3   Tired, decreased energy 1 1 1 3   Change in appetite 0 0 1   Feeling bad or failure about yourself  0 0 1 1  Trouble concentrating 0 0 1 1  Moving slowly or fidgety/restless 0 0 0 0  Suicidal thoughts 0 0 0 0  PHQ-9 Score 4 4 7 10   Difficult doing work/chores Not difficult at all Somewhat difficult  Somewhat difficult      11/20/2023    8:44 AM 10/18/2023    8:20 AM 05/24/2022    9:08 AM 04/25/2022   10:58 AM  GAD 7 : Generalized Anxiety Score  Nervous, Anxious, on Edge 1 1 1 1   Control/stop worrying 0 1 1 0  Worry too much - different things 0 1 1 0  Trouble relaxing 1 1 1  0  Restless 0 1 0 0  Easily annoyed or irritable 1 1 1 2   Afraid - awful might happen 0 1 0 0  Total GAD 7 Score 3 7 5 3   Anxiety Difficulty Not difficult at all   Somewhat difficult    The patient does not have a history of falls. I did not complete a risk assessment for falls. A plan of care for falls was not documented.   Past Medical History:  Past Medical History:  Diagnosis Date   Abnormal Pap smear of cervix     age 29-neg colposcopy--no treatment   Arthritis of knee, left    Asthma    mild, seasonal   Diabetes mellitus without complication (HCC)    gestational only   Dysmenorrhea    History of hiatal hernia    history of   History of iron deficiency anemia    Low vitamin D level 2020   OSA on CPAP    history of, lost weight   STD (sexually transmitted disease)    Hx HSV II   Trichomonas infection 2020    Surgical History:  Past Surgical History:  Procedure Laterality Date   CESAREAN SECTION  2006, 2012   delivered in [place]   CHOLECYSTECTOMY     FOOT SURGERY Left    HERNIA REPAIR     KNEE SURGERY Right    x2   LAPAROSCOPIC GASTRIC BAND REMOVAL WITH LAPAROSCOPIC GASTRIC SLEEVE RESECTION N/A 03/08/2020   Procedure: LAPAROSCOPIC GASTRIC BAND REMOVAL WITH LAPAROSCOPIC GASTRIC SLEEVE  RESECTION, Upper Endo, ERAS Pathway;  Surgeon: Luretha Murphy, MD;  Location: [facility];  Service: General;  Laterality: N/A;   LAPAROSCOPIC GASTRIC BANDING      Medications:  Current Outpatient Medications on File Prior to Visit  Medication Sig   Ergocalciferol (VITAMIN D2 PO) Take 1 capsule by mouth every 7 (seven) days.   hydrochlorothiazide (HYDRODIURIL) 25 MG tablet Take 1 tablet by mouth daily.   ibuprofen (ADVIL) 200 MG tablet Take 600 mg by mouth every 8 (eight) hours as needed (for pain.).   levonorgestrel (MIRENA, 52 MG,) 20 MCG/DAY IUD 1 each by Intrauterine route once.   fluticasone (FLONASE) 50 MCG/ACT nasal spray Place 2 sprays into both nostrils daily. (Patient not taking: Reported on 10/18/2023)   No current facility-administered medications on file prior to visit.    Allergies:  No Known Allergies  Social History:  Social History   Socioeconomic History   Marital status: Single    Spouse name: Not on file   Number of children: Not on file   Years of education: Not on file   Highest education level: Not on file  Occupational History   Not on file  Tobacco Use   Smoking  status: Former    Current packs/day: 0.25    Types: Cigarettes   Smokeless tobacco: Current   Tobacco comments:    Vapes Daily  Vaping Use   Vaping status: Every Day   Substances: Nicotine, Flavoring  Substance and Sexual Activity   Alcohol use: Yes    Alcohol/week: 5.0 standard drinks of alcohol    Types: 5 Glasses of wine per week    Comment: Occassionally.   Drug use: Not Currently    Types: Marijuana    Comment: Occassionally.   Sexual activity: Yes    Birth control/protection: I.U.D.  Other Topics Concern   Not on file  Social History Narrative   Not on file   Social Determinants of Health   Financial Resource Strain: Not on file  Food Insecurity: Not on file  Transportation Needs: Not on file  Physical Activity: Not on file  Stress: Not on file  Social Connections: Unknown (04/28/2022)   Received from [facility]   Social Network    Social Network: Not on file  Intimate Partner Violence: Unknown (03/20/2022)   Received from [facility], [facility]   HITS    Physically Hurt: Not on file    Insult or Talk Down To: Not on file    Threaten Physical Harm: Not on file    Scream or Curse: Not on file   Social History   Tobacco Use  Smoking Status Former   Current packs/day: 0.25   Types: Cigarettes  Smokeless Tobacco Current  Tobacco Comments   Vapes Daily   Social History   Substance and Sexual Activity  Alcohol Use Yes   Alcohol/week: 5.0 standard drinks of alcohol   Types: 5 Glasses of wine per week   Comment: Occassionally.    Family History:  Family History  Problem Relation Age of Onset   Osteoarthritis Mother    Diabetes Father    Diabetes Maternal Grandfather    Hypertension Maternal Grandfather     Past medical history, surgical history, medications, allergies, family history and social history reviewed with patient today and changes made to appropriate areas of the chart.   Review of Systems  Constitutional:  Negative.   HENT: Negative.    Eyes: Negative.   Respiratory: Negative.  Cardiovascular: Negative.   Gastrointestinal: Negative.   Genitourinary: Negative.   Musculoskeletal:  Positive for joint pain (knee).  Skin: Negative.   Neurological: Negative.   Psychiatric/Behavioral: Negative.     All other ROS negative except what is listed above and in the HPI.      Objective:    BP 124/82 (BP Location: Right Arm, Cuff Size: Large)   Pulse 73   Temp (!) 97 F (36.1 C)   Ht 5\' 9"  (1.753 m)   Wt (!) 327 lb (148.3 kg)   LMP 11/09/2023 (Exact Date)   SpO2 100%   BMI 48.29 kg/m   Wt Readings from Last 3 Encounters:  11/20/23 (!) 327 lb (148.3 kg)  10/22/23 (!) 328 lb 8 oz (149 kg)  10/18/23 (!) 328 lb 12.8 oz (149.1 kg)    Physical Exam Vitals and nursing note reviewed.  Constitutional:      General: She is not in acute distress.    Appearance: Normal appearance. She is obese.  HENT:     Head: Normocephalic and atraumatic.     Right Ear: Tympanic membrane, ear canal and external ear normal.     Left Ear: Tympanic membrane, ear canal and external ear normal.  Eyes:     Conjunctiva/sclera: Conjunctivae normal.  Cardiovascular:     Rate and Rhythm: Normal rate and regular rhythm.     Pulses: Normal pulses.     Heart sounds: Normal heart sounds.  Pulmonary:     Effort: Pulmonary effort is normal.     Breath sounds: Normal breath sounds.  Abdominal:     Palpations: Abdomen is soft.     Tenderness: There is no abdominal tenderness.  Musculoskeletal:        General: Normal range of motion.     Cervical back: Normal range of motion and neck supple.     Right lower leg: No edema.     Left lower leg: No edema.  Lymphadenopathy:     Cervical: No cervical adenopathy.  Skin:    General: Skin is warm and dry.  Neurological:     General: No focal deficit present.     Mental Status: She is alert and oriented to person, place, and time.     Cranial Nerves: No cranial nerve  deficit.     Coordination: Coordination normal.     Gait: Gait normal.  Psychiatric:        Mood and Affect: Mood normal.        Behavior: Behavior normal.        Thought Content: Thought content normal.        Judgment: Judgment normal.     Results for orders placed or performed in visit on 10/19/23  HM PAP SMEAR  Result Value Ref Range   HM Pap smear WNL   Results Console HPV  Result Value Ref Range   CHL HPV Negative       Assessment & Plan:   Problem List Items Addressed This Visit       Other   Morbid obesity (HCC)    BMI 48.2. Her insurance does not cover weight loss medications and she has a history of bariatric surgery. Discussed options and she would like to start phentermine. Will start phentermine 15mg  daily. Check blood pressure at home. Discussed possible side effects. Follow-up in 4-5 weeks.       Relevant Medications   phentermine 15 MG capsule   S/P laparoscopic sleeve gastrectomy    Reviewed labs.  Still struggling with weight loss. See plan for obesity above.       Anemia    Hgb and ferritin slightly low, she did not start an iron supplement yet. Recommend she start iron 65mg  daily with food.       Prediabetes    A1c 6%. Discussed nutrition, exercise.       Vitamin D deficiency    Continue vitamin D 50,000 units weekly.       Routine general medical examination at a health care facility - Primary    Health maintenance reviewed and updated. Discussed nutrition, exercise. Recent labs reviewed. Follow-up 1 year.          Follow up plan: Return in about 4 weeks (around 12/18/2023) for 4-5 weeks, weight management .   LABORATORY TESTING:  - Pap smear: up to date  IMMUNIZATIONS:   - Tdap: Tetanus vaccination status reviewed: Declined. - Influenza: Up to date - Pneumovax: Not applicable - Prevnar: Not applicable - HPV: Not applicable - Shingrix vaccine: Not applicable  SCREENING: -Mammogram: Not applicable  - Colonoscopy: Not  applicable  - Bone Density: Not applicable   PATIENT COUNSELING:   Advised to take 1 mg of folate supplement per day if capable of pregnancy.   Sexuality: Discussed sexually transmitted diseases, partner selection, use of condoms, avoidance of unintended pregnancy  and contraceptive alternatives.   Advised to avoid cigarette smoking.  I discussed with the patient that most people either abstain from alcohol or drink within safe limits (<=14/week and <=4 drinks/occasion for males, <=7/weeks and <= 3 drinks/occasion for females) and that the risk for alcohol disorders and other health effects rises proportionally with the number of drinks per week and how often a drinker exceeds daily limits.  Discussed cessation/primary prevention of drug use and availability of treatment for abuse.   Diet: Encouraged to adjust caloric intake to maintain  or achieve ideal body weight, to reduce intake of dietary saturated fat and total fat, to limit sodium intake by avoiding high sodium foods and not adding table salt, and to maintain adequate dietary potassium and calcium preferably from fresh fruits, vegetables, and low-fat dairy products.    stressed the importance of regular exercise  Injury prevention: Discussed safety belts, safety helmets, smoke detector, smoking near bedding or upholstery.   Dental health: Discussed importance of regular tooth brushing, flossing, and dental visits.    NEXT PREVENTATIVE PHYSICAL DUE IN 1 YEAR. Return in about 4 weeks (around 12/18/2023) for 4-5 weeks, weight management .  Lannie Heaps A Kennetha Pearman

## 2023-11-20 NOTE — Assessment & Plan Note (Signed)
Reviewed labs. Still struggling with weight loss. See plan for obesity above.

## 2023-11-20 NOTE — Patient Instructions (Addendum)
It was great to see you!  Start phentermine 1 capsule daily in the morning about 30 minutes before you eat.   Try to limit caffeine until you know how it makes you feel  Drink plenty of water  Start an iron supplement 65mg  once a day with food.   You can wear compression socks to help with the ankle swelling  Let's follow-up in 4-5 weeks, sooner if you have concerns.  If a referral was placed today, you will be contacted for an appointment. Please note that routine referrals can sometimes take up to 3-4 weeks to process. Please call our office if you haven't heard anything after this time frame.  Take care,  Rodman Pickle, NP

## 2023-11-20 NOTE — Assessment & Plan Note (Signed)
Health maintenance reviewed and updated. Discussed nutrition, exercise. Recent labs reviewed. Follow-up 1 year 

## 2023-11-27 ENCOUNTER — Ambulatory Visit (INDEPENDENT_AMBULATORY_CARE_PROVIDER_SITE_OTHER): Payer: No Typology Code available for payment source | Admitting: Neurology

## 2023-11-27 ENCOUNTER — Encounter: Payer: Self-pay | Admitting: Neurology

## 2023-11-27 VITALS — BP 134/96 | HR 84 | Ht 69.0 in | Wt 328.4 lb

## 2023-11-27 DIAGNOSIS — Z9884 Bariatric surgery status: Secondary | ICD-10-CM

## 2023-11-27 DIAGNOSIS — R351 Nocturia: Secondary | ICD-10-CM

## 2023-11-27 DIAGNOSIS — Z6841 Body Mass Index (BMI) 40.0 and over, adult: Secondary | ICD-10-CM

## 2023-11-27 DIAGNOSIS — G4733 Obstructive sleep apnea (adult) (pediatric): Secondary | ICD-10-CM

## 2023-11-27 DIAGNOSIS — G4700 Insomnia, unspecified: Secondary | ICD-10-CM

## 2023-11-27 DIAGNOSIS — R030 Elevated blood-pressure reading, without diagnosis of hypertension: Secondary | ICD-10-CM | POA: Diagnosis not present

## 2023-11-27 NOTE — Progress Notes (Signed)
Subjective:    Patient ID: Julia Davis is a 37 y.o. Davis.  HPI    Huston Foley, MD, PhD [facility] [address] [address] [place], [place] [address]  Dear Dr. Sheliah Hatch,   I saw your patient, Julia Davis, upon your kind request in my sleep clinic today for initial consultation of her sleep disorder, in particular, evaluation of her prior diagnosis of obstructive sleep apnea.  The patient is unaccompanied today.  As you know, Julia Davis is a 37 year old Davis with an underlying medical history of morbid obesity with a BMI of over 45, status post gastric band in 2013 with subsequent removal and conversion to gastric sleeve in 2021, reflux disease, hypertension, hiatal hernia, low vitamin D, asthma, and arthritis, who was diagnosed with obstructive sleep apnea in or around 2013.  As she recalls, she had mild or mild to moderate obstructive apnea and used a CPAP machine at the time.  After she achieved weight loss she stopped using her machine.  However, she has had significant weight fluctuation and gained most of her weight back that she had lost after the gastric band and after her sleeve gastrectomy she had lost about 20 to 25 pounds but gained most of it back.  She is working with a Health and safety inspector.  She has also established with a Veterinary surgeon for stress management.  Prior sleep study results are not available for my review today.  She had testing when she was still residing in [place].  Her Epworth sleepiness score is 6 out of 24, fatigue severity score is 43 out of 63.  She has difficulty staying asleep.  I reviewed your office note from 10/17/2023.  She is single and lives with her 81 year old son.  She has a 18 year old son lives in [place].  She is not aware of any family history of sleep apnea.  She recently started taking phentermine per PCP.  She started taking hydrochlorothiazide per GYN.  She works as a Optometrist.  She quit cigarette smoking  about a year ago but does vape daily.  She drinks alcohol daily in the form of the 3 drinks, as late as 9 PM.  She has nocturia about once per average night and denies recurrent nocturnal or morning headaches.  Bedtime is between 10 and 10:30 PM.  She has difficulty maintaining sleep.  Rise time is typically between 6:30 AM and 7 AM but often she is awake earlier than that.  They have no pets in the household. She would be willing to get reevaluated for sleep apnea and consider treatment with a PAP machine again.  Her Past Medical History Is Significant For: Past Medical History:  Diagnosis Date   Abnormal Pap smear of cervix    age 1-neg colposcopy--no treatment   Arthritis of knee, left    Asthma    mild, seasonal   Diabetes mellitus without complication (HCC)    gestational only   Dysmenorrhea    History of hiatal hernia    history of   History of iron deficiency anemia    Low vitamin D level 2020   OSA on CPAP    history of, lost weight   STD (sexually transmitted disease)    Hx HSV II   Trichomonas infection 2020    Her Past Surgical History Is Significant For: Past Surgical History:  Procedure Laterality Date   CESAREAN SECTION  2006, 2012   delivered in [place]   CHOLECYSTECTOMY     FOOT  SURGERY Left    HERNIA REPAIR     KNEE SURGERY Right    x2   LAPAROSCOPIC GASTRIC BAND REMOVAL WITH LAPAROSCOPIC GASTRIC SLEEVE RESECTION N/A 03/08/2020   Procedure: LAPAROSCOPIC GASTRIC BAND REMOVAL WITH LAPAROSCOPIC GASTRIC SLEEVE RESECTION, Upper Endo, ERAS Pathway;  Surgeon: Luretha Murphy, MD;  Location: [facility];  Service: General;  Laterality: N/A;   LAPAROSCOPIC GASTRIC BANDING      Her Family History Is Significant For: Family History  Problem Relation Age of Onset   Osteoarthritis Mother    Diabetes Father    Diabetes Maternal Grandfather    Hypertension Maternal Grandfather     Her Social History Is Significant For: Social History   Socioeconomic History    Marital status: Single    Spouse name: Not on file   Number of children: Not on file   Years of education: Not on file   Highest education level: Not on file  Occupational History   Not on file  Tobacco Use   Smoking status: Former    Current packs/day: 0.25    Types: Cigarettes   Smokeless tobacco: Current   Tobacco comments:    Vapes Daily  Vaping Use   Vaping status: Every Day   Substances: Nicotine, Flavoring  Substance and Sexual Activity   Alcohol use: Yes    Alcohol/week: 5.0 standard drinks of alcohol    Types: 5 Glasses of wine per week    Comment: Occassionally.   Drug use: Not Currently    Types: Marijuana    Comment: Occassionally.   Sexual activity: Yes    Birth control/protection: I.U.D.  Other Topics Concern   Not on file  Social History Narrative   Caffiene : oaccasional   Works:  Optometrist (daytime)   Live home son.    Social Determinants of Health   Financial Resource Strain: Not on file  Food Insecurity: Not on file  Transportation Needs: Not on file  Physical Activity: Not on file  Stress: Not on file  Social Connections: Unknown (04/28/2022)   [facility]   Social Network    Social Network: Not on file    Her Allergies Are:  No Known Allergies:   Her Current Medications Are:  Outpatient Encounter Medications as of 11/27/2023  Medication Sig   Ergocalciferol (VITAMIN D2 PO) Take 1 capsule by mouth every 7 (seven) days.   fluticasone (FLONASE) 50 MCG/ACT nasal spray Place 2 sprays into both nostrils daily.   hydrochlorothiazide (HYDRODIURIL) 25 MG tablet Take 1 tablet by mouth daily.   ibuprofen (ADVIL) 200 MG tablet Take 600 mg by mouth every 8 (eight) hours as needed (for pain.).   levonorgestrel (MIRENA, 52 MG,) 20 MCG/DAY IUD 1 each by Intrauterine route once.   phentermine 15 MG capsule Take 1 capsule (15 mg total) by mouth every morning.   No facility-administered encounter medications on file as  of 11/27/2023.  :   Review of Systems:  Out of a complete 14 point review of systems, all are reviewed and negative with the exception of these symptoms as listed below:  Review of Systems  Neurological:        Had sleep consult in [place].  Had moderate OSA. On cpap for 6 months, lost weight, stopped.  Now trouble staying sleeping. ESS 6, FSS 43.     Objective:  Neurological Exam  Physical Exam Physical Examination:   Vitals:   11/27/23 1507  BP: (!) 134/96  Pulse: 84  SpO2: 99%    General Examination: The patient is a very pleasant 37 y.o. Davis in no acute distress. She appears well-developed and no acute distress and well groomed.   HEENT: Normocephalic, atraumatic, pupils are equal, round and reactive to light, extraocular tracking is good without limitation to gaze excursion or nystagmus noted. Hearing is grossly intact. Face is symmetric with normal facial animation. Speech is clear with no dysarthria noted. There is no hypophonia. There is no lip, neck/head, jaw or voice tremor. Neck is supple with full range of passive and active motion. There are no carotid bruits on auscultation. Oropharynx exam reveals: mild mouth dryness, good dental hygiene and moderate airway crowding, due to small airway entry, Mallampati class III.  Tonsillar size of about 1+, right side easier to see compared to left.  Neck circumference 15-7/8 inches.  Minimal to mild overbite.  Chest: Clear to auscultation without wheezing, rhonchi or crackles noted.  Heart: S1+S2+0, regular and normal without murmurs, rubs or gallops noted.   Abdomen: Soft, non-tender and non-distended.  Extremities: There is no pitting edema in the distal lower extremities bilaterally.   Skin: Warm and dry without trophic changes noted.   Musculoskeletal: exam reveals no obvious joint deformities.   Neurologically:  Mental status: The patient is awake, alert and oriented in all 4 spheres. Her immediate and remote memory,  attention, language skills and fund of knowledge are appropriate. There is no evidence of aphasia, agnosia, apraxia or anomia. Speech is clear with normal prosody and enunciation. Thought process is linear. Mood is normal and affect is normal.  Cranial nerves II - XII are as described above under HEENT exam.  Motor exam: Normal bulk, strength and tone is noted. There is no obvious action or resting tremor.  Fine motor skills and coordination: grossly intact.  Cerebellar testing: No dysmetria or intention tremor. There is no truncal or gait ataxia.  Sensory exam: intact to light touch in the upper and lower extremities.  Gait, station and balance: She stands easily. No veering to one side is noted. No leaning to one side is noted. Posture is age-appropriate and stance is narrow based. Gait shows normal stride length and normal pace. No problems turning are noted.   Assessment and Plan:   In summary, Julia Davis is a very pleasant 37 y.o.-year old Davis with an underlying medical history of morbid obesity with a BMI of over 45, status post gastric band removal and conversion to gastric sleeve in 2021, reflux disease, hypertension, hiatal hernia, low vitamin D, asthma, and arthritis, who presents for evaluation of her prior diagnosis of obstructive sleep apnea.  She was diagnosed with OSA prior to lumbar Lap-Band surgery in 2013.  She had PAP therapy in the past, but no longer uses a PAP machine.  She had a reversal of gastric band and a conversion to a sleeve gastrectomy in 2021.  She does recall that PAP therapy was uncomfortable but she is open to trying it again. I had a long chat with the patient about my findings and the diagnosis of sleep apnea, particularly OSA, its prognosis and treatment options. We talked about medical/conservative treatments, surgical interventions and non-pharmacological approaches for symptom control. I explained, in particular, the risks and ramifications of untreated  moderate to severe OSA, especially with respect to developing cardiovascular disease down the road, including congestive heart failure (CHF), difficult to treat hypertension, cardiac arrhythmias (particularly A-fib), neurovascular complications including TIA, stroke and dementia. Even type 2 diabetes has, in  part, been linked to untreated OSA. Symptoms of untreated OSA may include (but may not be limited to) daytime sleepiness, nocturia (i.e. frequent nighttime urination), memory problems, mood irritability and suboptimally controlled or worsening mood disorder such as depression and/or anxiety, lack of energy, lack of motivation, physical discomfort, as well as recurrent headaches, especially morning or nocturnal headaches. We talked about the importance of maintaining a healthy lifestyle and striving for healthy weight.  The importance of complete nicotine cessation was also addressed.  In addition, we talked about the importance of striving for and maintaining good sleep hygiene.  She is advised to cut back on her daily alcohol consumption. I recommended a sleep study at this time. I outlined the differences between a laboratory attended sleep study which is considered more comprehensive and accurate over the option of a home sleep test (HST); the latter may lead to underestimation of sleep disordered breathing in some instances and does not help with diagnosing upper airway resistance syndrome and is not accurate enough to diagnose primary central sleep apnea typically. I outlined possible surgical and non-surgical treatment options of OSA, including the use of a positive airway pressure (PAP) device (i.e. CPAP, AutoPAP/APAP or BiPAP in certain circumstances), a custom-made dental device (aka oral appliance, which would require a referral to a specialist dentist or orthodontist typically, and is generally speaking not considered for patients with full dentures or edentulous state), upper airway surgical  options, such as traditional UPPP (which is not considered a first-line treatment) or the Inspire device (hypoglossal nerve stimulator, which would involve a referral for consultation with an ENT surgeon, after careful selection, following inclusion criteria - also not first-line treatment). I explained the PAP treatment option to the patient in detail, as this is generally considered first-line treatment.  The patient indicated that she would be willing to try PAP therapy, if the need arises. I explained the importance of being compliant with PAP treatment, not only for insurance purposes but primarily to improve patient's symptoms symptoms, and for the patient's long term health benefit, including to reduce Her cardiovascular risks longer-term.    We will pick up our discussion about the next steps and treatment options after testing.  We will keep her posted as to the test results by phone call and/or MyChart messaging where possible.  We will plan to follow-up in sleep clinic accordingly as well.  I answered all her questions today and the patient was in agreement.   I encouraged her to call with any interim questions, concerns, problems or updates or email [place] through MyChart.  Generally speaking, sleep test authorizations may take up to 2 weeks, sometimes less, sometimes longer, the patient is encouraged to get in touch with [place] if they do not hear back from the sleep lab staff directly within the next 2 weeks.  Thank you very much for allowing me to participate in the care of this nice patient. If I can be of any further assistance to you please do not hesitate to call me at [phone].  Sincerely,   Huston Foley, MD, PhD

## 2023-11-27 NOTE — Patient Instructions (Signed)

## 2023-11-30 ENCOUNTER — Ambulatory Visit: Payer: No Typology Code available for payment source | Admitting: Neurology

## 2023-11-30 DIAGNOSIS — R030 Elevated blood-pressure reading, without diagnosis of hypertension: Secondary | ICD-10-CM

## 2023-11-30 DIAGNOSIS — G4733 Obstructive sleep apnea (adult) (pediatric): Secondary | ICD-10-CM

## 2023-11-30 DIAGNOSIS — G4734 Idiopathic sleep related nonobstructive alveolar hypoventilation: Secondary | ICD-10-CM

## 2023-11-30 DIAGNOSIS — Z9884 Bariatric surgery status: Secondary | ICD-10-CM

## 2023-11-30 DIAGNOSIS — R351 Nocturia: Secondary | ICD-10-CM

## 2023-11-30 DIAGNOSIS — G4700 Insomnia, unspecified: Secondary | ICD-10-CM

## 2023-12-20 ENCOUNTER — Telehealth: Payer: Self-pay

## 2023-12-20 ENCOUNTER — Other Ambulatory Visit (HOSPITAL_COMMUNITY): Payer: Self-pay

## 2023-12-20 NOTE — Telephone Encounter (Signed)
 Please advise, see below.

## 2023-12-20 NOTE — Telephone Encounter (Signed)
 Copied from CRM [id number]. Topic: General - Other >> Dec 18, 2023  1:22 PM Robinson H wrote: Reason for CRM: Patient calling in to have an prior authorization sent to [facility] for the Wegovy  that her and provider previously talked about. Patient states main insurance won't cover medication but secondary will just needs prior authorization sent.  Ertha [phone]

## 2023-12-20 NOTE — Telephone Encounter (Signed)
 Pharmacy Patient Advocate Encounter   Received notification from Pt Calls Messages that prior authorization for Wegovy  0.25mg *05ml is required/requested.   Insurance verification completed.   The patient is insured through [facility] MEDICAID .   Per test claim: PA required; PA submitted to above mentioned insurance via CoverMyMeds Key/confirmation #/[id number] Status is pending

## 2023-12-21 NOTE — Telephone Encounter (Signed)
 Pharmacy Patient Advocate Encounter  Received notification from [facility] MEDICAID that Prior Authorization for Wegovy  has been DENIED.  See denial reason below. No denial letter attached in CMM. Will attach denial letter to Media tab once received.   PA #/Case ID/Reference #: [id number]

## 2023-12-21 NOTE — Telephone Encounter (Signed)
 Noted, she is currently taking phentermine.

## 2023-12-25 ENCOUNTER — Encounter: Payer: Self-pay | Admitting: Dietician

## 2023-12-25 ENCOUNTER — Encounter: Payer: No Typology Code available for payment source | Attending: General Surgery | Admitting: Dietician

## 2023-12-25 DIAGNOSIS — E669 Obesity, unspecified: Secondary | ICD-10-CM | POA: Diagnosis present

## 2023-12-25 NOTE — Progress Notes (Signed)
 Medical Nutrition Therapy Follow-up  Appointment Start time:  [id number]  Appointment End time: 0929  Primary concerns today: weight gain  Referral diagnosis: obesity; s/p sleeve surgery Preferred learning style: no preference indicated (auditory, visual, hands on, no preference indicated) Learning readiness: ready (not ready, contemplating, ready, change in progress)  NUTRITION ASSESSMENT   Anthropometrics Start weight at [facility]: 318.3 lbs (date: 12/23/2019)   Height: 69 inches Weight today: 326.2 lb   Clinical Medical Hx: sleep apnea, obesity, GERD Medications: omeprazole, vit D, mirena  Labs: vit D 12.79; A1c 6.0; iron 32; RBC 3.71; HCT 30.3; hemoglobin 9.2; RDW 20.7 Notable Signs/Symptoms: none noted  Body Composition Scale 10/22/2023 12/25/2023  Current Body Weight 328.5 326.2  Total Body Fat % 48.2 48.1  Visceral Fat 17 17  Fat-Free Mass % 51.7 51.8   Total Body Water % 40.3 40.4  Muscle-Mass lbs 37.1 37.0  BMI 48.3 48.0  Body Fat Displacement           Torso  lbs 98.4 97.5         Left Leg  lbs 19.6 19.5         Right Leg  lbs 19.6 19.5         Left Arm  lbs 9.8 9.7         Right Arm  lbs 9.8 9.7   Lifestyle & Dietary Hx  Pt states she has not been tracking her protein. Pt states she started eating snacks, stating she is skipping lunch. Pt states she has been getting back to the gym. Pt states she has decreased alcohol consumption. Pt states she is having an increase of GERD, stating that she has an appointment with the surgeon to discuss a revision. 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 Pt states she practices not drinking with meals. Pt states the surgeon would like for pt to be re-tested for sleep apnea, with a sleep test.  Estimated daily fluid intake: 64 oz Supplements: vit D Sleep: pt states she does not use a CPAP machine; uses alcohol daily Stress / self-care: 5 (scale of 1-10) on average, stating she will get overwhelmed at times. Current  average weekly physical activity: gym 3 days per week (cardio 15-20 minutes, 40-45 resistance)  24-Hr Dietary Recall First Meal: protein shake or greek yogurt Snack: almonds, fruit or other nuts Second Meal: skip Snack: almonds, fruit or other nuts Third Meal: wings and brussel sprouts  Snack: almonds, fruit or other nuts Beverages: water, alcohol (2-3 drinks daily)  Estimated Energy Needs Calories: 1600   NUTRITION DIAGNOSIS  Overweight/obesity (Stewart-3.3) related to past poor dietary habits and physical inactivity as evidenced by completed bariatric surgery and following dietary guidelines for continued weight loss and healthy nutrition status.   NUTRITION INTERVENTION Nutrition counseling (C-1) and education (E-2) to facilitate bariatric surgery goals, including: The importance of consuming adequate calories as well as certain nutrients daily due to the body's need for essential vitamins, minerals, and fats The importance of daily physical activity and to reach a goal of at least 150 minutes of moderate to vigorous physical activity weekly (or as directed by their physician) due to benefits such as increased musculature and improved lab values The importance of intuitive eating specifically learning hunger-satiety cues and understanding the importance of learning a new body: The importance of mindful eating to avoid grazing behaviors  Encouraged patient to honor their body's internal hunger and fullness cues.  Throughout the day, check in mentally and  rate hunger. Stop eating when satisfied not full regardless of how much food is left on the plate.  Get more if still hungry 20-30 minutes later.  The key is to honor satisfaction so throughout the meal, rate fullness factor and stop when comfortably satisfied not physically full. The key is to honor hunger and fullness without any feelings of guilt or shame.  Pay attention to what the internal cues are, rather than any external factors. This will  enhance the confidence you have in listening to your own body and following those internal cues enabling you to increase how often you eat when you are hungry not out of appetite and stop when you are satisfied not full.  Encouraged pt to continue to eat balanced meals inclusive of non starchy vegetables 2 times a day 7 days a week Encouraged pt to choose lean protein sources: limiting beef, pork, sausage, hotdogs, and lunch meat Encourage pt to choose healthy fats such as plant based limiting animal fats Encouraged pt to continue to drink a minium 64 fluid ounces with half being plain water to satisfy proper hydration  Why you need complex carbohydrates: Whole grains and other complex carbohydrates are required to have a healthy diet. Whole grains provide fiber which can help with blood glucose levels and help keep you satiated. Fruits and starchy vegetables provide essential vitamins and minerals required for immune function, eyesight support, brain support, bone density, wound healing and many other functions within the body. According to the current evidenced based 2020-2025 Dietary Guidelines for Americans, complex carbohydrates are part of a healthy eating pattern which is associated with a decreased risk for type 2 diabetes, cancers, and cardiovascular disease.   Handouts Provided Include  Bariatric MyPlate Body Comp Scale readout  Learning Style & Readiness for Change Teaching method utilized: Visual & Auditory  Demonstrated degree of understanding via: Teach Back  Barriers to learning/adherence to lifestyle change: nothing identified  Goals Established by Pt Continue: Increase physical activity; aim to go to gym after work 3-4 days a week, 60 minutes Continue: Decrease alcohol consumption; eliminate drinks during the weekday Re-engage: Aim for 60 grams of protein per day; track your protein intake New: use alkaline water and small frequent meals to reduce reflux.  MONITORING &  EVALUATION Dietary intake, weekly physical activity.  Next Steps  Patient will call for follow-up after consultation with surgeon.

## 2023-12-26 ENCOUNTER — Ambulatory Visit: Payer: No Typology Code available for payment source | Admitting: Nurse Practitioner

## 2023-12-26 ENCOUNTER — Encounter: Payer: Self-pay | Admitting: Nurse Practitioner

## 2023-12-26 VITALS — BP 124/80 | HR 76 | Temp 97.6°F | Ht 69.0 in | Wt 330.0 lb

## 2023-12-26 DIAGNOSIS — E559 Vitamin D deficiency, unspecified: Secondary | ICD-10-CM

## 2023-12-26 DIAGNOSIS — Z6841 Body Mass Index (BMI) 40.0 and over, adult: Secondary | ICD-10-CM

## 2023-12-26 MED ORDER — PHENTERMINE HCL 30 MG PO CAPS: 30.0000 mg | ORAL_CAPSULE | ORAL | 0 refills | Status: DC

## 2023-12-26 NOTE — Assessment & Plan Note (Signed)
 She expresses frustration with lack of weight loss despite efforts. She is currently on Phentermine  with no side effects and stable blood pressure. She recently started consulting a nutritionist, who advised she might be under-eating. We will increase Phentermine  to 30mg  daily. She should continue regular exercise and follow the nutritionist's advice. Follow-up is scheduled in two months.

## 2023-12-26 NOTE — Progress Notes (Signed)
 Established Patient Office Visit  Subjective   Patient ID: Julia Davis, female    DOB: October 19, 1986  Age: 38 y.o. MRN: [id number]  Chief Complaint  Patient presents with   Weight Management    Follow up, no concerns    HPI  Discussed the use of AI scribe software for clinical note transcription with the patient, who gave verbal consent to proceed.  History of Present Illness   The patient, with a history of obesity, presents for a follow-up visit regarding her weight management. She has been taking phentermine , which has affected her appetite. However, she has been inconsistent with the medication due to forgetfulness. She denies any side effects such as trouble sleeping or heart racing. She has been trying to adjust her diet and has recently seen a nutritionist. She has also been trying to exercise three days a week. Despite these efforts, she expresses frustration with not seeing any weight loss. The nutritionist suggested that she might be under eating.  In addition to her weight management, the patient has been taking vitamin D  for a previous low level. She also had an IUD removed a few weeks ago due to it being embedded in her cervix. She is not currently on any form of birth control.        ROS See pertinent positives and negatives per HPI.    Objective:     BP 124/80 (BP Location: Left Arm, Patient Position: Sitting, Cuff Size: Large)   Pulse 76   Temp 97.6 F (36.4 C)   Ht 5' 9 (1.753 m)   Wt (!) 330 lb (149.7 kg)   LMP 12/11/2023 (Approximate)   SpO2 96%   BMI 48.73 kg/m  BP Readings from Last 3 Encounters:  12/26/23 124/80  11/27/23 (!) 134/96  11/20/23 124/82   Wt Readings from Last 3 Encounters:  12/26/23 (!) 330 lb (149.7 kg)  11/27/23 (!) 328 lb 6.4 oz (149 kg)  11/20/23 (!) 327 lb (148.3 kg)      Physical Exam Vitals and nursing note reviewed.  Constitutional:      General: She is not in acute distress.    Appearance: Normal appearance.  She is obese.  HENT:     Head: Normocephalic.  Eyes:     Conjunctiva/sclera: Conjunctivae normal.  Cardiovascular:     Rate and Rhythm: Normal rate and regular rhythm.     Pulses: Normal pulses.     Heart sounds: Normal heart sounds.  Pulmonary:     Effort: Pulmonary effort is normal.     Breath sounds: Normal breath sounds.  Musculoskeletal:     Cervical back: Normal range of motion.  Skin:    General: Skin is warm.  Neurological:     General: No focal deficit present.     Mental Status: She is alert and oriented to person, place, and time.  Psychiatric:        Mood and Affect: Mood normal.        Behavior: Behavior normal.        Thought Content: Thought content normal.        Judgment: Judgment normal.      Assessment & Plan:   Problem List Items Addressed This Visit       Other   Morbid obesity (HCC)   She expresses frustration with lack of weight loss despite efforts. She is currently on Phentermine  with no side effects and stable blood pressure. She recently started consulting a nutritionist, who advised she might  be under-eating. We will increase Phentermine  to 30mg  daily. She should continue regular exercise and follow the nutritionist's advice. Follow-up is scheduled in two months.      Relevant Medications   phentermine  30 MG capsule   Vitamin D  deficiency - Primary   She was previously diagnosed with low Vitamin D  levels. We will continue Vitamin D  supplementation 50,000 units weekly.        Return in about 2 months (around 02/23/2024) for weight management .    Tinnie DELENA Harada, NP

## 2023-12-26 NOTE — Assessment & Plan Note (Signed)
 She was previously diagnosed with low Vitamin D levels. We will continue Vitamin D supplementation 50,000 units weekly.

## 2023-12-26 NOTE — Patient Instructions (Signed)
 It was great to see you!  Increase your phentermine  to 30mg  daily  Keep focusing on your nutrition and exercise  Let's follow-up in 2 months, sooner if you have concerns.  If a referral was placed today, you will be contacted for an appointment. Please note that routine referrals can sometimes take up to 3-4 weeks to process. Please call our office if you haven't heard anything after this time frame.  Take care,  Tinnie Harada, NP

## 2024-01-02 NOTE — Progress Notes (Signed)
 See procedure note.

## 2024-01-04 ENCOUNTER — Other Ambulatory Visit: Payer: Self-pay | Admitting: Neurology

## 2024-01-04 DIAGNOSIS — G4733 Obstructive sleep apnea (adult) (pediatric): Secondary | ICD-10-CM

## 2024-01-04 NOTE — Procedures (Signed)
[facility]  HOME SLEEP TEST (SANSA) REPORT (Mail-Out Device):   STUDY DATE: 12/20/23  DOB: [birth year]  MRN: [id number]  ORDERING CLINICIAN: Huston Foley, MD, PhD   REFERRING CLINICIAN: Dr. Feliciana Rossetti  CLINICAL INFORMATION/HISTORY:  38 year old female with an underlying medical history of morbid obesity with a BMI of over 45, status post gastric band in 2013 with subsequent removal and conversion to gastric sleeve in 2021, reflux disease, hypertension, hiatal hernia, low vitamin D, asthma, and arthritis, who was diagnosed with obstructive sleep apnea in or around 2013.  She has had significant weight fluctuation and presents for re-evaluation of her OSA.   PATIENT'S LAST REPORTED EPWORTH SLEEPINESS SCORE (ESS): 6/24.  BMI (at the time of sleep clinic visit and/or test date): 48.5 kg/m  FINDINGS:   Study Protocol:    The SANSA single-point-of-skin-contact chest-worn sensor - an FDA and DOT approved type 4 home sleep test device - measures eight physiological channels,  including blood oxygen saturation (measured via PPG [photoplethysmography]), EKG-derived heart rate, respiratory effort, chest movement (measured via accelerometer), snoring, body position, and actigraphy. The device is designed to be worn for up to 10 hours per study.   Sleep Summary:   Total Recording Time (hours, min): 9 hours, 57 min  Total Effective Sleep Time (hours, min):  5 hours, 48 min  Sleep Efficiency (%):    85%   Respiratory Indices:   Calculated sAHI (per hour):  10.5/hour         Oxygen Saturation Statistics:    Oxygen Saturation (%) Mean: 89.8%   Minimum oxygen saturation (%):                 64.5%   O2 Saturation Range (%): 64.5-97%    Pulse Rate Statistics:   Pulse Mean (bpm):    82/min    Pulse Range (62-117/min)   Snoring: Intermittent, mild to moderate  IMPRESSION/DIAGNOSES:   OSA (obstructive sleep apnea) Oxygen desaturations during sleep     RECOMMENDATIONS:   This home sleep test demonstrates overall mild obstructive sleep apnea - by number of events - with a total AHI of 10.5/hour, but significant oxygen desaturation and a mean oxygen saturation of only 89.8% for the night.  This raises the possibility of underlying obesity hypoventilation syndrome.  Snoring was intermittent and mostly in the mild range.  Given the patient's medical history and sleep related complaints, therapy with a positive airway pressure device is recommended. Treatment can be achieved in the form of autoPAP trial/titration at home for now. A full night, in-lab PAP titration study may aid in improving proper treatment settings and with mask fit, if needed, down the road. Alternative treatments may include weight loss (where appropriate) along with avoidance of the supine sleep position (if possible), or an oral appliance in appropriate candidates.   Please note that untreated obstructive sleep apnea may carry additional perioperative morbidity. Patients with significant obstructive sleep apnea should receive perioperative PAP therapy and the surgeons and particularly the anesthesiologist should be informed of the diagnosis and the severity of the sleep disordered breathing. The patient should be cautioned not to drive, work at heights, or operate dangerous or heavy equipment when tired or sleepy. Review and reiteration of good sleep hygiene measures should be pursued with any patient. Other causes of the patient's symptoms, including circadian rhythm disturbances, an underlying mood disorder, medication effect and/or an underlying medical problem cannot be ruled out based on this test. Clinical correlation is recommended.  The patient  and her referring provider will be notified of the test results. The patient will be seen in follow up in sleep clinic at [facility], as necessary.  I certify that I have reviewed the raw data recording prior to the issuance of this report in  accordance with the standards of the American Academy of Sleep Medicine (AASM).    INTERPRETING PHYSICIAN:   Huston Foley, MD, PhD Medical Director, [facility]) Diplomat, ABPN (Neurology and Sleep)   [facility] [address] [place], [place] [address] [phone]

## 2024-01-08 ENCOUNTER — Telehealth: Payer: Self-pay | Admitting: *Deleted

## 2024-01-08 NOTE — Telephone Encounter (Signed)
-----   Message from Huston Foley sent at 01/04/2024 10:24 AM EST ----- Patient referred by Dr. Sheliah Hatch, seen by me on 11/27/2023, patient had HST on 12/20/2023.    Please call and notify the patient that the recent home sleep test showed obstructive sleep apnea. OSA is overall mild, but by oxygen criteria, she had several desaturations in her average oxygen saturation was just below 90% for the night.  I would recommend starting her on home AutoPap therapy while continuing to work on weight loss.  If she is agreeable, I have already placed an order for AutoPap therapy.  We we will send the order to a DME company of her choice.  The DME representative will educate her on how to use the machine, how to put the mask on, etc. If she is okay with this approach, please send referral, talk to patient, send report to referring MD. We will need a FU in sleep clinic in about 2.-3 months post-PAP set up (which is usually an insurance-mandated appointment to monitor compliance), please arrange that with me or one of our NPs. Please also go over the need for compliance with treatment (including the insurance-imposed minimum compliance percentage). Thanks,   Huston Foley, MD, PhD [facility])

## 2024-01-08 NOTE — Telephone Encounter (Signed)
Zott, Linnell Fulling, Otilio Jefferson, RN; [place], [place] Got It thank you

## 2024-01-08 NOTE — Telephone Encounter (Signed)
Informed pt of their sleep study results. The study showed mild OSA but several desaturations. Per Dr. Teofilo Pod recommendations, the pt was advised to start autopap therapy at home. She has verbalized understanding and agreement to proceed with autopap. Her questions were answered. We discussed the insurance compliance requirements which includes using the machine at least 4 hours at night and also being seen by our office between 30 and 90 days after setup. The pt scheduled initial f/u for 03/27/24 at 9:00 AM. Discussed DME, will refer to [facility]. Pt verbalized appreciation for the call.   Order sent to [facility]. Sleep study report sent to referring provider.

## 2024-01-16 ENCOUNTER — Other Ambulatory Visit: Payer: Self-pay | Admitting: General Surgery

## 2024-01-16 DIAGNOSIS — Z9884 Bariatric surgery status: Secondary | ICD-10-CM

## 2024-01-16 DIAGNOSIS — K2100 Gastro-esophageal reflux disease with esophagitis, without bleeding: Secondary | ICD-10-CM

## 2024-02-14 ENCOUNTER — Other Ambulatory Visit: Payer: Self-pay | Admitting: General Surgery

## 2024-02-14 ENCOUNTER — Ambulatory Visit
Admission: RE | Admit: 2024-02-14 | Discharge: 2024-02-14 | Disposition: A | Discharge status: Home / Self Care | Payer: No Typology Code available for payment source | Source: Ambulatory Visit | Attending: General Surgery | Admitting: General Surgery

## 2024-02-14 DIAGNOSIS — K2100 Gastro-esophageal reflux disease with esophagitis, without bleeding: Secondary | ICD-10-CM

## 2024-02-14 DIAGNOSIS — Z9884 Bariatric surgery status: Secondary | ICD-10-CM

## 2024-02-18 ENCOUNTER — Ambulatory Visit (INDEPENDENT_AMBULATORY_CARE_PROVIDER_SITE_OTHER): Admitting: Family Medicine

## 2024-02-18 ENCOUNTER — Encounter: Payer: Self-pay | Admitting: Family Medicine

## 2024-02-18 ENCOUNTER — Ambulatory Visit (INDEPENDENT_AMBULATORY_CARE_PROVIDER_SITE_OTHER)
Admission: RE | Admit: 2024-02-18 | Discharge: 2024-02-18 | Disposition: A | Discharge status: Home / Self Care | Source: Ambulatory Visit | Attending: Family Medicine | Admitting: Family Medicine

## 2024-02-18 VITALS — BP 132/86 | HR 80 | Temp 98.3°F | Ht 69.0 in | Wt 329.4 lb

## 2024-02-18 DIAGNOSIS — S63501A Unspecified sprain of right wrist, initial encounter: Secondary | ICD-10-CM

## 2024-02-18 NOTE — Progress Notes (Signed)
 Established Patient Office Visit   Subjective:  Patient ID: Julia Davis, female    DOB: [birth year]  Age: 38 y.o. MRN: [id number]  Chief Complaint  Patient presents with   Finger Injury    Pt fell on her right hand 1 week ago. Now has pain, edema, redness and brusing in right thumb.     HPI Encounter Diagnoses  Name Primary?   Sprain of right wrist, initial encounter Yes   FOOSH injury to right wrist about a week ago.  There is persisting swelling and discomfort.  Range of motion through thumb is painful.  Right-hand-dominant.   Review of Systems  Constitutional: Negative.   HENT: Negative.    Eyes:  Negative for blurred vision, discharge and redness.  Respiratory: Negative.    Cardiovascular: Negative.   Gastrointestinal:  Negative for abdominal pain.  Genitourinary: Negative.   Musculoskeletal:  Positive for joint pain. Negative for myalgias.  Skin:  Negative for rash.  Neurological:  Negative for tingling, loss of consciousness and weakness.  Endo/Heme/Allergies:  Negative for polydipsia.     Current Outpatient Medications:    Ergocalciferol (VITAMIN D2 PO), Take 1 capsule by mouth every 7 (seven) days., Disp: , Rfl:    fluticasone (FLONASE) 50 MCG/ACT nasal spray, Place 2 sprays into both nostrils daily., Disp: 16 g, Rfl: 0   hydrochlorothiazide (HYDRODIURIL) 25 MG tablet, Take 1 tablet by mouth daily., Disp: , Rfl:    ibuprofen (ADVIL) 200 MG tablet, Take 600 mg by mouth every 8 (eight) hours as needed (for pain.)., Disp: , Rfl:    phentermine 30 MG capsule, Take 1 capsule (30 mg total) by mouth every morning., Disp: 30 capsule, Rfl: 0   Objective:     BP 132/86   Pulse 80   Temp 98.3 F (36.8 C)   Ht 5\' 9"  (1.753 m)   Wt (!) 329 lb 6.4 oz (149.4 kg)   SpO2 94%   BMI 48.64 kg/m    Physical Exam Constitutional:      General: She is not in acute distress.    Appearance: Normal appearance. She is not ill-appearing, toxic-appearing or diaphoretic.   HENT:     Head: Normocephalic and atraumatic.     Right Ear: External ear normal.     Left Ear: External ear normal.  Eyes:     General: No scleral icterus.       Right eye: No discharge.        Left eye: No discharge.     Extraocular Movements: Extraocular movements intact.     Conjunctiva/sclera: Conjunctivae normal.  Pulmonary:     Effort: Pulmonary effort is normal. No respiratory distress.  Musculoskeletal:     Right wrist: Swelling, bony tenderness and snuff box tenderness present. Decreased range of motion.     Comments: Tenderness to palpation of ASB and distal radius.  Skin:    General: Skin is warm and dry.  Neurological:     Mental Status: She is alert and oriented to person, place, and time.  Psychiatric:        Mood and Affect: Mood normal.        Behavior: Behavior normal.      No results found for any visits on 02/18/24.    The ASCVD Risk score (Arnett DK, et al., 2019) failed to calculate for the following reasons:   The 2019 ASCVD risk score is only valid for ages 53 to 75    Assessment & Plan:   Sprain  of right wrist, initial encounter -     DG Wrist Complete Right; Future    No follow-ups on file.    Mliss Sax, MD

## 2024-02-25 ENCOUNTER — Telehealth: Payer: Self-pay | Admitting: *Deleted

## 2024-02-25 NOTE — Telephone Encounter (Signed)
 Copied from CRM [id number]. Topic: Clinical - Lab/Test Results >> Feb 25, 2024  9:29 AM [name] wrote: Reason for CRM: Patient is calling to discuss her most recent x-ray results.

## 2024-02-26 ENCOUNTER — Telehealth: Payer: Self-pay

## 2024-02-26 NOTE — Telephone Encounter (Signed)
 Marland Kitchen

## 2024-03-05 ENCOUNTER — Ambulatory Visit (INDEPENDENT_AMBULATORY_CARE_PROVIDER_SITE_OTHER): Payer: No Typology Code available for payment source | Admitting: Nurse Practitioner

## 2024-03-05 ENCOUNTER — Encounter: Payer: Self-pay | Admitting: Nurse Practitioner

## 2024-03-05 VITALS — BP 126/84 | Temp 97.8°F | Wt 325.0 lb

## 2024-03-05 DIAGNOSIS — E559 Vitamin D deficiency, unspecified: Secondary | ICD-10-CM

## 2024-03-05 DIAGNOSIS — S6991XD Unspecified injury of right wrist, hand and finger(s), subsequent encounter: Secondary | ICD-10-CM

## 2024-03-05 DIAGNOSIS — E538 Deficiency of other specified B group vitamins: Secondary | ICD-10-CM | POA: Diagnosis not present

## 2024-03-05 DIAGNOSIS — D508 Other iron deficiency anemias: Secondary | ICD-10-CM

## 2024-03-05 DIAGNOSIS — R7303 Prediabetes: Secondary | ICD-10-CM | POA: Diagnosis not present

## 2024-03-05 DIAGNOSIS — S6991XA Unspecified injury of right wrist, hand and finger(s), initial encounter: Secondary | ICD-10-CM | POA: Insufficient documentation

## 2024-03-05 MED ORDER — METFORMIN HCL ER 500 MG PO TB24: 500.0000 mg | ORAL_TABLET | Freq: Every day | ORAL | 1 refills | Status: DC

## 2024-03-05 NOTE — Assessment & Plan Note (Signed)
 She experiences pain and popping in her hand following a fall. X-rays showed no overt fractures, and the final radiologist reading is pending. Wear a thumb spica brace for a couple of weeks. Use ice and ibuprofen for pain management. Follow up on x-ray results.

## 2024-03-05 NOTE — Assessment & Plan Note (Signed)
 Start vitamin D 2,000 units daily in addition to multivitamin.

## 2024-03-05 NOTE — Assessment & Plan Note (Signed)
 Start multivitamin daily with iron.

## 2024-03-05 NOTE — Assessment & Plan Note (Signed)
 She experiences difficulty with weight loss despite a healthy diet and previous phentermine use. Exercise has been halted due to a hand injury and menstruation. Insurance does not cover weight loss medications. Hormonal factors and potential insulin resistance may contribute to her weight loss challenges. Start metformin 500mg  once daily with food to aid weight loss. Continue following up with the dietician. Restart vitamin D 2000 units daily and start multivitamin with iron.

## 2024-03-05 NOTE — Progress Notes (Signed)
 Established Patient Office Visit  Subjective   Patient ID: Julia Davis, female    DOB: 1986/11/10  Age: 38 y.o. MRN: [id number]  Chief Complaint  Patient presents with   Weight Management    Follow up and discuss other options    HPI  Discussed the use of AI scribe software for clinical note transcription with the patient, who gave verbal consent to proceed.  History of Present Illness   The patient, with a history of prediabetes and low vitamin D and B12 levels, presents with concerns about weight loss and a hand injury. She reports stopping phentermine, a weight loss medication, due to loss of appetite and is struggling with weight loss despite maintaining a healthy diet. The patient describes her diet as healthy, including protein shakes, salads, and lean proteins. She also reports a recent hand injury from a fall, which is causing pain and discomfort. The patient has had an x-ray for the hand injury but is awaiting results. She reports pain in the hand and a popping sensation when moving it. The patient also mentions a history of low vitamin D and B12 levels and is not currently taking supplements for these.      ROS See pertinent positives and negatives per HPI.    Objective:     BP 126/84 (BP Location: Left Arm, Patient Position: Sitting, Cuff Size: Normal)   Temp 97.8 F (36.6 C)   Wt (!) 325 lb (147.4 kg)   LMP 02/09/2024   BMI 47.99 kg/m  BP Readings from Last 3 Encounters:  03/05/24 126/84  02/18/24 132/86  12/26/23 124/80   Wt Readings from Last 3 Encounters:  03/05/24 (!) 325 lb (147.4 kg)  02/18/24 (!) 329 lb 6.4 oz (149.4 kg)  12/26/23 (!) 330 lb (149.7 kg)      Physical Exam Vitals and nursing note reviewed.  Constitutional:      General: She is not in acute distress.    Appearance: Normal appearance.  HENT:     Head: Normocephalic.  Eyes:     Conjunctiva/sclera: Conjunctivae normal.  Cardiovascular:     Rate and Rhythm: Normal rate and  regular rhythm.     Pulses: Normal pulses.     Heart sounds: Normal heart sounds.  Pulmonary:     Effort: Pulmonary effort is normal.     Breath sounds: Normal breath sounds.  Musculoskeletal:     Cervical back: Normal range of motion.  Skin:    General: Skin is warm.  Neurological:     General: No focal deficit present.     Mental Status: She is alert and oriented to person, place, and time.  Psychiatric:        Mood and Affect: Mood normal.        Behavior: Behavior normal.        Thought Content: Thought content normal.        Judgment: Judgment normal.      Assessment & Plan:   Problem List Items Addressed This Visit       Other   Morbid obesity (HCC)   She experiences difficulty with weight loss despite a healthy diet and previous phentermine use. Exercise has been halted due to a hand injury and menstruation. Insurance does not cover weight loss medications. Hormonal factors and potential insulin resistance may contribute to her weight loss challenges. Start metformin 500mg  once daily with food to aid weight loss. Continue following up with the dietician. Restart vitamin D 2000 units daily and  start multivitamin with iron.       Relevant Medications   metFORMIN (GLUCOPHAGE-XR) 500 MG 24 hr tablet   Anemia   Start multivitamin daily with iron.       Prediabetes - Primary   Prediabetes was identified in previous blood work. Start metformin XR 500mg  once daily with food to improve insulin sensitivity.      Vitamin D deficiency   Start vitamin D 2,000 units daily in addition to multivitamin.       Vitamin B12 deficiency   Start multivitamin daily      Injury of right hand   She experiences pain and popping in her hand following a fall. X-rays showed no overt fractures, and the final radiologist reading is pending. Wear a thumb spica brace for a couple of weeks. Use ice and ibuprofen for pain management. Follow up on x-ray results.        Return in about 3  months (around 06/05/2024) for weight management .    Gerre Scull, NP

## 2024-03-05 NOTE — Assessment & Plan Note (Signed)
 Prediabetes was identified in previous blood work. Start metformin XR 500mg  once daily with food to improve insulin sensitivity.

## 2024-03-05 NOTE — Patient Instructions (Addendum)
 It was great to see you!  Start metformin 1 tablet daily with food   Keep following with your dietician   Start vitamin D 2,000 units daily and a multivitamin daily with iron  Start wearing a thumb spica brace for a couple weeks to help with your wrist.   Let's follow-up in 3 months, sooner if you have concerns.  If a referral was placed today, you will be contacted for an appointment. Please note that routine referrals can sometimes take up to 3-4 weeks to process. Please call our office if you haven't heard anything after this time frame.  Take care,  Rodman Pickle, NP

## 2024-03-05 NOTE — Assessment & Plan Note (Signed)
Start multivitamin daily.

## 2024-03-06 ENCOUNTER — Encounter: Payer: Self-pay | Admitting: Family Medicine

## 2024-03-26 NOTE — Progress Notes (Unsigned)
 PATIENT: Julia Davis DOB: [birth year]  REASON FOR VISIT: follow up HISTORY FROM: patient  No chief complaint on file.    HISTORY OF PRESENT ILLNESS:  03/26/24 ALL:  Julia Davis is a 38 y.o. female here today for follow up for OSA on CPAP. She was seen in consult with Dr Dannial Monarch 11/2023 for history of OSA previously on CPAP but stopped following weight loss. She had gained weight back and complained of fatigue.  HST 11/2023 showed overall mild obstructive sleep apnea - by number of events - with a total AHI of 10.5/hour, but significant oxygen desaturation and a mean oxygen saturation of only 89.8% for the night. This raises the possibility of underlying obesity hypoventilation syndrome. AutoPAP advised. Since,     HISTORY: (copied from Dr Atahr's previous note)  Dear Dr. Sheliah Hatch,    I saw your patient, Julia Davis, upon your kind request in my sleep clinic today for initial consultation of her sleep disorder, in particular, evaluation of her prior diagnosis of obstructive sleep apnea.  The patient is unaccompanied today.  As you know, Ms. Baus is a 38 year old female with an underlying medical history of morbid obesity with a BMI of over 45, status post gastric band in 2013 with subsequent removal and conversion to gastric sleeve in 2021, reflux disease, hypertension, hiatal hernia, low vitamin D, asthma, and arthritis, who was diagnosed with obstructive sleep apnea in or around 2013.  As she recalls, she had mild or mild to moderate obstructive apnea and used a CPAP machine at the time.  After she achieved weight loss she stopped using her machine.  However, she has had significant weight fluctuation and gained most of her weight back that she had lost after the gastric band and after her sleeve gastrectomy she had lost about 20 to 25 pounds but gained most of it back.  She is working with a Health and safety inspector.  She has also established with a Veterinary surgeon for stress management.   Prior sleep study results are not available for my review today.  She had testing when she was still residing in [place].  Her Epworth sleepiness score is 6 out of 24, fatigue severity score is 43 out of 63.  She has difficulty staying asleep.  I reviewed your office note from 10/17/2023.  She is single and lives with her 50 year old son.  She has a 39 year old son lives in [place].  She is not aware of any family history of sleep apnea.  She recently started taking phentermine per PCP.  She started taking hydrochlorothiazide per GYN.  She works as a Optometrist.  She quit cigarette smoking about a year ago but does vape daily.  She drinks alcohol daily in the form of the 3 drinks, as late as 9 PM.  She has nocturia about once per average night and denies recurrent nocturnal or morning headaches.  Bedtime is between 10 and 10:30 PM.  She has difficulty maintaining sleep.  Rise time is typically between 6:30 AM and 7 AM but often she is awake earlier than that.  They have no pets in the household. She would be willing to get reevaluated for sleep apnea and consider treatment with a PAP machine again.   REVIEW OF SYSTEMS: Out of a complete 14 system review of symptoms, the patient complains only of the following symptoms, fatigue and all other reviewed systems are negative.  ESS: previously 6/24  ALLERGIES: No Known Allergies  HOME MEDICATIONS: Outpatient Medications Prior to Visit  Medication Sig Dispense Refill   Ergocalciferol (VITAMIN D2 PO) Take 1 capsule by mouth every 7 (seven) days. (Patient not taking: Reported on 03/05/2024)     ibuprofen (ADVIL) 200 MG tablet Take 600 mg by mouth every 8 (eight) hours as needed (for pain.). (Patient not taking: Reported on 03/05/2024)     metFORMIN (GLUCOPHAGE-XR) 500 MG 24 hr tablet Take 1 tablet (500 mg total) by mouth daily with breakfast. 30 tablet 1   No facility-administered medications prior to visit.    PAST MEDICAL HISTORY: Past Medical  History:  Diagnosis Date   Abnormal Pap smear of cervix    age 73-neg colposcopy--no treatment   Arthritis of knee, left    Asthma    mild, seasonal   Diabetes mellitus without complication (HCC)    gestational only   Dysmenorrhea    History of hiatal hernia    history of   History of iron deficiency anemia    Low vitamin D level 2020   OSA on CPAP    history of, lost weight   STD (sexually transmitted disease)    Hx HSV II   Trichomonas infection 2020    PAST SURGICAL HISTORY: Past Surgical History:  Procedure Laterality Date   CESAREAN SECTION  2006, 2012   delivered in [place]   CHOLECYSTECTOMY     FOOT SURGERY Left    HERNIA REPAIR     KNEE SURGERY Right    x2   LAPAROSCOPIC GASTRIC BAND REMOVAL WITH LAPAROSCOPIC GASTRIC SLEEVE RESECTION N/A 03/08/2020   Procedure: LAPAROSCOPIC GASTRIC BAND REMOVAL WITH LAPAROSCOPIC GASTRIC SLEEVE RESECTION, Upper Endo, ERAS Pathway;  Surgeon: Luretha Murphy, MD;  Location: [facility];  Service: General;  Laterality: N/A;   LAPAROSCOPIC GASTRIC BANDING      FAMILY HISTORY: Family History  Problem Relation Age of Onset   Osteoarthritis Mother    Diabetes Father    Diabetes Maternal Grandfather    Hypertension Maternal Grandfather     SOCIAL HISTORY: Social History   Socioeconomic History   Marital status: Single    Spouse name: Not on file   Number of children: Not on file   Years of education: Not on file   Highest education level: Not on file  Occupational History   Not on file  Tobacco Use   Smoking status: Former    Current packs/day: 0.25    Types: Cigarettes   Smokeless tobacco: Current   Tobacco comments:    Vapes Daily  Vaping Use   Vaping status: Every Day   Substances: Nicotine, Flavoring  Substance and Sexual Activity   Alcohol use: Yes    Alcohol/week: 5.0 standard drinks of alcohol    Types: 5 Glasses of wine per week    Comment: Occassionally.   Drug use: Not Currently    Types: Marijuana     Comment: Occassionally.   Sexual activity: Yes    Birth control/protection: I.U.D.  Other Topics Concern   Not on file  Social History Narrative   Caffiene : oaccasional   Works:  Optometrist (daytime)   Live home son.    Social Drivers of Corporate investment banker Strain: Not on file  Food Insecurity: Not on file  Transportation Needs: Not on file  Physical Activity: Not on file  Stress: Not on file  Social Connections: Unknown (04/28/2022)   Received from [facility]   Social Network    Social Network: Not on file  Intimate Partner Violence: Unknown (03/20/2022)  Received from [facility]   HITS    Physically Hurt: Not on file    Insult or Talk Down To: Not on file    Threaten Physical Harm: Not on file    Scream or Curse: Not on file     PHYSICAL EXAM  There were no vitals filed for this visit. There is no height or weight on file to calculate BMI.  Generalized: Well developed, in no acute distress  Cardiology: normal rate and rhythm, no murmur noted Respiratory: clear to auscultation bilaterally  Neurological examination  Mentation: Alert oriented to time, place, history taking. Follows all commands speech and language fluent Cranial nerve II-XII: Pupils were equal round reactive to light. Extraocular movements were full, visual field were full  Motor: The motor testing reveals 5 over 5 strength of all 4 extremities. Good symmetric motor tone is noted throughout.  Gait and station: Gait is normal.    DIAGNOSTIC DATA (LABS, IMAGING, TESTING) - I reviewed patient records, labs, notes, testing and imaging myself where available.      No data to display           Lab Results  Component Value Date   WBC 4.7 10/18/2023   HGB 9.2 (L) 10/18/2023   HCT 30.3 (L) 10/18/2023   MCV 81.7 10/18/2023   PLT 321.0 10/18/2023      Component Value Date/Time   NA 142 10/18/2023 0843   NA 141 01/15/2019 0936   K 4.0 10/18/2023  0843   CL 105 10/18/2023 0843   CO2 28 10/18/2023 0843   GLUCOSE 88 10/18/2023 0843   BUN 13 10/18/2023 0843   BUN 10 01/15/2019 0936   CREATININE 0.86 10/18/2023 0843   CALCIUM 9.0 10/18/2023 0843   PROT 7.0 10/18/2023 0843   PROT 6.9 01/15/2019 0936   ALBUMIN 3.9 10/18/2023 0843   ALBUMIN 4.4 01/15/2019 0936   AST 19 10/18/2023 0843   ALT 15 10/18/2023 0843   ALKPHOS 52 10/18/2023 0843   BILITOT 0.5 10/18/2023 0843   BILITOT 0.5 01/15/2019 0936   GFRNONAA >60 09/20/2023 1352   GFRAA >60 03/08/2020 1105   Lab Results  Component Value Date   CHOL 168 10/18/2023   HDL 61.70 10/18/2023   LDLCALC 93 10/18/2023   TRIG 68.0 10/18/2023   CHOLHDL 3 10/18/2023   Lab Results  Component Value Date   HGBA1C 6.0 10/18/2023   Lab Results  Component Value Date   VITAMINB12 226 10/18/2023   Lab Results  Component Value Date   TSH 2.31 04/28/2022     ASSESSMENT AND PLAN 38 y.o. year old female  has a past medical history of Abnormal Pap smear of cervix, Arthritis of knee, left, Asthma, Diabetes mellitus without complication (HCC), Dysmenorrhea, History of hiatal hernia, History of iron deficiency anemia, Low vitamin D level (2020), OSA on CPAP, STD (sexually transmitted disease), and Trichomonas infection (2020). here with   No diagnosis found.    Lilie Vezina is doing well on CPAP therapy. Compliance report reveals ***. *** was encouraged to continue using CPAP nightly and for greater than 4 hours each night. We will update supply orders as indicated. Risks of untreated sleep apnea review and education materials provided. Healthy lifestyle habits encouraged. *** will follow up in ***, sooner if needed. *** verbalizes understanding and agreement with this plan.    No orders of the defined types were placed in this encounter.    No orders of the defined types were placed  in this encounter.     Shawnie Dapper, FNP-C 03/26/2024, 9:57 AM [facility] [address] [place], [place] [address] [phone]

## 2024-03-26 NOTE — Patient Instructions (Incomplete)

## 2024-03-26 NOTE — Progress Notes (Unsigned)
 Julia Davis

## 2024-03-27 ENCOUNTER — Ambulatory Visit (INDEPENDENT_AMBULATORY_CARE_PROVIDER_SITE_OTHER): Payer: No Typology Code available for payment source | Admitting: Family Medicine

## 2024-03-27 ENCOUNTER — Other Ambulatory Visit: Payer: Self-pay | Admitting: Nurse Practitioner

## 2024-03-27 ENCOUNTER — Telehealth: Payer: Self-pay

## 2024-03-27 ENCOUNTER — Encounter: Payer: Self-pay | Admitting: Family Medicine

## 2024-03-27 VITALS — BP 129/69 | HR 75 | Ht 69.0 in | Wt 327.5 lb

## 2024-03-27 DIAGNOSIS — Z789 Other specified health status: Secondary | ICD-10-CM

## 2024-03-27 DIAGNOSIS — Z9884 Bariatric surgery status: Secondary | ICD-10-CM

## 2024-03-27 DIAGNOSIS — G4733 Obstructive sleep apnea (adult) (pediatric): Secondary | ICD-10-CM

## 2024-03-27 MED ORDER — ZEPBOUND 2.5 MG/0.5ML ~~LOC~~ SOAJ: 2.5000 mg | SUBCUTANEOUS | 1 refills | Status: DC

## 2024-03-27 NOTE — Telephone Encounter (Signed)
 I called and spoke with patient and she said that he insurance does not cover Wegovy or ozempic

## 2024-03-27 NOTE — Telephone Encounter (Signed)
 Forwarding message below.  LOV 03/05/2024  Neuro appointment was on 03/27/2024 with Shawnie Dapper, NP

## 2024-03-27 NOTE — Telephone Encounter (Signed)
 Copied from CRM [id number]. Topic: Clinical - Medication Question >> Mar 27, 2024 11:10 AM Arley Phenix D wrote: Reason for CRM: Patient stated that she had an appointment with her neurologist today and was advised to talk to PCP regarding zepbound medication for the patient. Patient wants to know if Dr.McElwee can start prescribing her the medication.

## 2024-03-28 ENCOUNTER — Other Ambulatory Visit (HOSPITAL_COMMUNITY): Payer: Self-pay

## 2024-03-28 ENCOUNTER — Telehealth: Payer: Self-pay

## 2024-03-28 ENCOUNTER — Other Ambulatory Visit: Payer: Self-pay | Admitting: Nurse Practitioner

## 2024-03-28 NOTE — Telephone Encounter (Signed)
 Pharmacy Patient Advocate Encounter   Received notification from Pt Calls Messages that prior authorization for Zepbound 2.5mg /0.25ml is required/requested.   Insurance verification completed.   The patient is insured through [facility] MEDICAID .   Per test claim: PA required; PA submitted to above mentioned insurance via Phone Key/confirmation #/EOC [id number] Status is pending

## 2024-03-28 NOTE — Telephone Encounter (Signed)
 Pharmacy Patient Advocate Encounter   Received notification from Pt Calls Messages that prior authorization for Zepbound 2.5mg /0.75ml is required/requested.   Insurance verification completed.   The patient is insured through [facility] .   Per test claim: PA required; PA submitted to above mentioned insurance via CoverMyMeds Key/confirmation #/EOC [id number] Status is pending   PA was cancelled due to it is a plan exclusion and not covered.

## 2024-03-28 NOTE — Telephone Encounter (Signed)
 Requesting: METFORMIN HCL ER 500 MG TABLET  Last Visit: 03/05/2024 Next Visit: 06/16/2024 Last Refill: 03/05/2024  Please Advise   Pharmacy comment: REQUEST FOR 90 DAYS PRESCRIPTION.

## 2024-03-28 NOTE — Telephone Encounter (Signed)
 I called and spoke with patient and notified her that Zepbound required a prior auth and it has been submitted and I will keep her up to date when we hear back from the authorization.

## 2024-03-28 NOTE — Telephone Encounter (Signed)
 Can we get a prior auth on ZEPBOUND 2.5 MG/0.5 ML PEN ?

## 2024-03-29 ENCOUNTER — Other Ambulatory Visit: Payer: Self-pay | Admitting: Nurse Practitioner

## 2024-03-29 DIAGNOSIS — G4733 Obstructive sleep apnea (adult) (pediatric): Secondary | ICD-10-CM

## 2024-03-31 ENCOUNTER — Telehealth: Payer: Self-pay

## 2024-03-31 ENCOUNTER — Other Ambulatory Visit (HOSPITAL_COMMUNITY): Payer: Self-pay

## 2024-03-31 ENCOUNTER — Other Ambulatory Visit: Payer: Self-pay | Admitting: Nurse Practitioner

## 2024-03-31 MED ORDER — WEGOVY 0.25 MG/0.5ML ~~LOC~~ SOAJ: 0.2500 mg | SUBCUTANEOUS | 1 refills | Status: DC

## 2024-03-31 NOTE — Telephone Encounter (Signed)
 Pharmacy Patient Advocate Encounter   Received notification from CoverMyMeds that prior authorization for  [facility] 0.25MG /0.5ML auto-injectors is required/requested.   Insurance verification completed.   The patient is insured through [facility] .   Per test claim: PA required; PA submitted to above mentioned insurance via CoverMyMeds Key/confirmation #/EOC [facility] Status is pending

## 2024-03-31 NOTE — Telephone Encounter (Signed)
 I called and spoke with patient and notified her that we received a notification today that Zepbound was denied. In place of the Zepbound Julia Davis sent in a Rx of Wegovy to see if her insurance will cover this per their preference.

## 2024-05-05 NOTE — Telephone Encounter (Signed)
 Pharmacy Patient Advocate Encounter  Received notification from [employer] that Prior Authorization for  Wegovy  0.25MG /0.5ML auto-injectors has been DENIED.  See denial reason below. No denial letter attached in CMM. Will attach denial letter to Media tab once received.   PA #/Case ID/Reference #: [id number]

## 2024-05-06 NOTE — Telephone Encounter (Signed)
 I called and spoke with patient and notified her of below message and she would like to be referred to [facility]. She said that she has already seen a nutritionist.

## 2024-05-06 NOTE — Addendum Note (Signed)
 Addended by: Lalia Loudon A on: 05/06/2024 10:02 AM   Modules accepted: Orders

## 2024-06-16 ENCOUNTER — Ambulatory Visit: Admitting: Nurse Practitioner

## 2024-06-23 ENCOUNTER — Encounter (INDEPENDENT_AMBULATORY_CARE_PROVIDER_SITE_OTHER): Payer: Self-pay

## 2024-06-23 ENCOUNTER — Institutional Professional Consult (permissible substitution) (INDEPENDENT_AMBULATORY_CARE_PROVIDER_SITE_OTHER): Admitting: Adult Health

## 2024-07-02 ENCOUNTER — Encounter: Payer: Self-pay | Admitting: Nurse Practitioner

## 2024-07-22 ENCOUNTER — Ambulatory Visit (INDEPENDENT_AMBULATORY_CARE_PROVIDER_SITE_OTHER): Admitting: Family Medicine

## 2024-07-22 ENCOUNTER — Encounter (INDEPENDENT_AMBULATORY_CARE_PROVIDER_SITE_OTHER): Payer: Self-pay | Admitting: Family Medicine

## 2024-07-22 VITALS — BP 130/82 | HR 79 | Temp 98.1°F | Ht 67.0 in | Wt 327.0 lb

## 2024-07-22 DIAGNOSIS — R7303 Prediabetes: Secondary | ICD-10-CM | POA: Diagnosis not present

## 2024-07-22 DIAGNOSIS — G4733 Obstructive sleep apnea (adult) (pediatric): Secondary | ICD-10-CM

## 2024-07-22 DIAGNOSIS — Z6841 Body Mass Index (BMI) 40.0 and over, adult: Secondary | ICD-10-CM

## 2024-07-22 DIAGNOSIS — Z9884 Bariatric surgery status: Secondary | ICD-10-CM

## 2024-07-22 NOTE — Progress Notes (Signed)
 Barnie DOROTHA Jenkins, DO, ABFM, ABOM Bariatric physician [address] [place], [place], [place] [address] Office: [phone]  /  Fax: [phone]    Initial Evaluation:  Julia Davis was seen in clinic today to evaluate for obesity. She is interested in losing weight to improve overall health and reduce the risk of weight related complications. She presents today to review program treatment options, initial physical assessment, and evaluation.     She was referred by: PCP  When asked what they hope to accomplish? She states: improve existing medical conditions, improve quality of life.   When asked how has your weight affected you? She states: Contributed to medical problems, Contributed to orthopedic problems or mobility issues, Having fatigue, and Having poor endurance  Contributing factors to her weight change: family history of obesity and life events  Some associated conditions: Arthritis: knees, OSA, and Prediabetes  Current nutrition plan: None  Current level of physical activity: None  Previous pharmacotherapy/Responses to medications:   Her PCP wrote for Wegovy  + Zepbound , however it was not covered by her insurance.   Phentermine : states it was ineffective and just lowered her appetite, ultimately it was discontinued.   She was on Metformin  in early 2025 to aid with weight loss and to improve insulin sensitivity; the Metformin  was not effective for weight loss and so it was discontinued.    Past Medical History:  Diagnosis Date   Abnormal Pap smear of cervix    age 80-neg colposcopy--no treatment   Arthritis of knee, left    Asthma    mild, seasonal   Diabetes mellitus without complication (HCC)    gestational only   Dysmenorrhea    History of hiatal hernia    history of   History of iron deficiency anemia    Low vitamin D  level 2020   OSA on CPAP    history of, lost weight   STD (sexually transmitted disease)    Hx HSV II   Trichomonas infection 2020     Current Outpatient Medications  Medication Instructions   ibuprofen (ADVIL) 600 mg, Every 8 hours PRN     No Known Allergies   Past Surgical History:  Procedure Laterality Date   CESAREAN SECTION  2006, 2012   delivered in [place]    CHOLECYSTECTOMY     FOOT SURGERY Left    HERNIA REPAIR     KNEE SURGERY Right    x2   LAPAROSCOPIC GASTRIC BAND REMOVAL WITH LAPAROSCOPIC GASTRIC SLEEVE RESECTION N/A 03/08/2020   Procedure: LAPAROSCOPIC GASTRIC BAND REMOVAL WITH LAPAROSCOPIC GASTRIC SLEEVE RESECTION, Upper Endo, ERAS Pathway;  Surgeon: Gladis Cough, MD;  Location: [facility];  Service: General;  Laterality: N/A;   LAPAROSCOPIC GASTRIC BANDING       Family History  Problem Relation Age of Onset   Osteoarthritis Mother    Diabetes Father    Diabetes Maternal Grandfather    Hypertension Maternal Grandfather      Objective:  BP 130/82   Pulse 79   Temp 98.1 F (36.7 C)   Ht 5' 7 (1.702 m)   Wt (!) 327 lb (148.3 kg)   SpO2 100%   BMI 51.22 kg/m  She was weighed on the bioimpedance scale: Body mass index is 51.22 kg/m.  Visceral Fat rating : 19, Body Fat %: 55.1  No data recorded No data recorded   Vitals Temp: 98.1 F (36.7 C) BP: 130/82 Pulse Rate: 79 SpO2: 100 %   Anthropometric Measurements Height: 5' 7 (1.702 m) Weight: ROLLEN)  327 lb (148.3 kg) BMI (Calculated): 51.2 Peak Weight: 379 lb   Body Composition  Body Fat %: 55.1 % Fat Mass (lbs): 180.6 lbs Muscle Mass (lbs): 139.6 lbs Total Body Water (lbs): 139.6 lbs Visceral Fat Rating : 19   Other Clinical Data Fasting: yes Labs: no Today's Visit #: Info Session Comments: Info Session    General: Well Developed, well nourished, and in no acute distress.  HEENT: Normocephalic, atraumatic; EOMI, sclerae are anicteric. Skin: Warm and dry, good turgor Chest:  Normal excursion, shape, no gross ABN Respiratory: No conversational dyspnea; speaking in full sentences NeuroM-Sk:  Normal gross ROM * 4  extremities  Psych: A and O *3, insight adequate, mood- full    Assessment and Plan:    FOR THE DISEASE OF OBESITY:  Morbid obesity (HCC) Assessment & Plan: We reviewed anthropometrics, biometrics, associated medical conditions and contributing factors with patient. Julia Davis would benefit from a medically tailored reduced calorie nutrional plan based on their REE (resting energy expenditure), which will be determined by indirect calorimetry.  We will also assess for cardiometabolic risk and nutritional derangements via fasting labs at intake appointment.    Obesity Treatment / Action Plan:   she was weighed on the bioimpedance scale and results were discussed and documented in the synopsis.   Julia Davis will complete provided nutritional and psychosocial assessment questionnaire before the next appointment.  she will be scheduled for indirect calorimetry to determine resting energy expenditure in a fasting state.  This will allow us  to create a reduced calorie, high-protein meal plan to promote loss of fat mass while preserving muscle mass.  We will also assess for cardiometabolic risk and nutritional derangements via an ECG and fasting serologies at her next appointment.  she was encouraged to work on amassing support from family and friends to begin their weight loss journey.   Work on eliminating or reducing the presence of highly processed, poorly nutritious, calorie-dense foods in the home.   Obesity Education Performed Today:  Patient was counseled on nutritional approaches to weight loss and benefits of reducing processed foods and consuming plant-based foods and high quality protein as part of nutritional weight management program.   We discussed the importance of long term lifestyle changes which include nutrition, exercise and behavioral modifications as well as the importance of customizing this to her specific health and social needs.   We discussed the benefits  of reaching a healthier weight to alleviate the symptoms of existing conditions and reduce the risks of the biomechanical, metabolic and psychological effects of obesity.  Was counseled on the health benefits of losing 5%-10% of total body weight.  Was counseled on our cognitive behavorial therapy program, lead by our bariatric psychologist, who focuses on emotional eating and creating positive behavorial change.  Was counseled on bariatric pharmacotherapy and how this may be used as an adjunct in their weight management   Julia Davis appears to be in the action stage of change and states they are ready to start intensive lifestyle modifications and behavioral modifications.  It was recommended that she follow up in the next 1-2 weeks to review the above steps, and to continue with treatment of their chronic disease state of obesity   FOR OTHER CONDITIONS RELATED TO THE DISEASE OF OBESITY:   Obstructive sleep apnea syndrome Assessment & Plan: Reviewed pt's OV note with Dr.Athar dated 11/27/2023. Pt has a history of OSA which was diagnosed in or around 2013. HST 12/20/23 demonstrated mild OSA with a total  AHI of 10.5/hour, but significant oxygen desaturation and a mean oxygen saturation of only 89.8% for the night. Pt was provided a PAP machine but has not been compliant with it. She states it is very uncomfortable to use.   - Counseled on risks associated with untreated sleep apnea on metabolism and overall health.  - Recommend she f/up with sleep medicine to consider alternative treatment options.  - Losing 10-15% or more of adipose tissue may improve condition.    S/P laparoscopic sleeve gastrectomy Assessment & Plan: She is status post lap band in 2013 with subsequent removal and conversion to gastric sleeve in 02/2020. Her highest weight prior to any procedures was 360-370 lbs. Her nadir weight was 255 lbs in 2017; she was exercising regularly at that time. Current weight: 327 lbs.   -  Screen for nutritional deficiencies with intake labs if she decides to join our program.  - She would benefit from a medically supervised weight management plan that is high in protein and low in simple carbohydrates.    Prediabetes Assessment & Plan: Lab Results  Component Value Date   HGBA1C 6.0 10/18/2023   HGBA1C 5.5 04/28/2022    She has a history of gestational diabetes around 2012. States she was diagnosed with pre-diabetes 1-2 years ago. She was on Metformin  in early 2025 to aid with weight loss and to improve insulin sensitivity; the Metformin  was discontinued d/t ineffectiveness.   - She would benefit from a balanced diet consisting of lean proteins, fruits, and vegetables while limiting simple carbohydrates.  - Check fasting blood glucose, insulin levels, and A1c at next OV.  - Will counsel patient on pathophysiology of disease process at 1st follow up visit.    Attestations:   I, Special Puri, acting as a Stage manager for [employer], DO., have compiled all relevant documentation for today's office visit on behalf of Barnie Jenkins, DO, while in the presence of [employer], DO.  I have spent 51 minutes in the care of the patient today including 44 minutes on face to face counseling of the patient on the disease of obesity and what our program can do for their medical conditions as well as in preventing future diseases. I discussed the importance of comprehensive care in the treatment of obesity including mental well being and physical activity.   I have reviewed the above documentation for accuracy and completeness, and I agree with the above. Barnie JINNY Jenkins, D.O.  The 21st Century Cures Act was signed into law in 2016 which includes the topic of electronic health records.  This provides immediate access to information in MyChart.  This includes consultation notes, operative notes, office notes, lab results and pathology reports.  If you have any questions about  what you read please let us  know at your next visit so we can discuss your concerns and take corrective action if need be.  We are right here with you!

## 2024-08-15 ENCOUNTER — Ambulatory Visit: Payer: Self-pay

## 2024-08-15 NOTE — Telephone Encounter (Signed)
 Noted. Appointment made for 08/19/24.

## 2024-08-15 NOTE — Telephone Encounter (Signed)
 FYI Only or Action Required?: FYI only for provider.  Patient was last seen in primary care on 07/22/2024 by Julia Sober, DO.  Called Nurse Triage reporting Back Pain.  Symptoms began several months ago.  Interventions attempted: Nothing.  Symptoms are: unchanged.  Triage Disposition: See PCP When Office is Open (Within 3 -4 Days)  Patient/caregiver understands and will follow disposition?: Yes   Copied from CRM [id number]. Topic: Clinical - Red Word Triage >> Aug 15, 2024 10:11 AM Robinson H wrote: [facility] that prompted transfer to Nurse Triage: Shooting pain from lower back to back of knee and tailbone Reason for Disposition  [1] MODERATE back pain (e.g., interferes with normal activities) AND [2] present > 3 days  Answer Assessment - Initial Assessment Questions 1. ONSET: When did the pain begin? (e.g., minutes, hours, days)     June 05, 2024 2. LOCATION: Where does it hurt? (upper, mid or lower back)     Lower back 3. SEVERITY: How bad is the pain?  (e.g., Scale 1-10; mild, moderate, or severe)     Mild to moderate 4. PATTERN: Is the pain constant? (e.g., yes, no; constant, intermittent)      Comes and goes 5. RADIATION: Does the pain shoot into your legs or somewhere else?     To back of knee and tailbone 6. CAUSE:  What do you think is causing the back pain?      Jumped 20 feet back in June 7. BACK OVERUSE:  Any recent lifting of heavy objects, strenuous work or exercise?     denies 8. MEDICINES: What have you taken so far for the pain? (e.g., nothing, acetaminophen , NSAIDS)     advil 9. NEUROLOGIC SYMPTOMS: Do you have any weakness, numbness, or problems with bowel/bladder control?     denies 10. OTHER SYMPTOMS: Do you have any other symptoms? (e.g., fever, abdomen pain, burning with urination, blood in urine)       denies 11. PREGNANCY: Is there any chance you are pregnant? When was your last menstrual period?       na  Protocols used:  Back Pain-A-AH

## 2024-08-19 ENCOUNTER — Ambulatory Visit (INDEPENDENT_AMBULATORY_CARE_PROVIDER_SITE_OTHER): Admitting: Internal Medicine

## 2024-08-19 ENCOUNTER — Encounter: Payer: Self-pay | Admitting: Internal Medicine

## 2024-08-19 VITALS — BP 128/82 | HR 68 | Ht 67.0 in | Wt 330.0 lb

## 2024-08-19 DIAGNOSIS — M5432 Sciatica, left side: Secondary | ICD-10-CM | POA: Diagnosis not present

## 2024-08-19 MED ORDER — NAPROXEN 500 MG PO TABS: 500.0000 mg | ORAL_TABLET | Freq: Two times a day (BID) | ORAL | 0 refills | Status: AC

## 2024-08-19 MED ORDER — PREDNISONE 10 MG (21) PO TBPK: ORAL_TABLET | ORAL | 0 refills | Status: DC

## 2024-08-19 MED ORDER — METHOCARBAMOL 500 MG PO TABS: 500.0000 mg | ORAL_TABLET | Freq: Three times a day (TID) | ORAL | 0 refills | Status: DC | PRN

## 2024-08-19 NOTE — Progress Notes (Signed)
 [facility]IMARY [facility] [address] [place] [address] Dept: [phone] Dept Fax: [phone]  Acute Care Office Visit  Subjective:   Julia Davis August 30, 1986 08/19/2024  Chief Complaint  Patient presents with   Back Pain    For several months; left side, radiating down leg; takes Ibuprofen, helps some; denies numbness/tingling    HPI:  Discussed the use of AI scribe software for clinical note transcription with the patient, who gave verbal consent to proceed.  History of Present Illness   Julia Davis is a 38 year old female who presents with back pain radiating to the left leg.  The back pain began after she jumped from a height of approximately twenty feet into a body of water during a trip to [place] in June 2025. The pain started the day after the jump and has progressively worsened since June. She denies hitting hard surface of bottom of pool/body of water when she jumped in.  Initially, she thought it might be a urinary tract infection as the pain was located near her kidneys, but has no urinary symptoms.   The pain originates in the lower back, specifically around the tailbone, and radiates down the left leg to the back of the knee, sometimes extending to the ankle. It is particularly noticeable at night and in the morning. No numbness, tingling, or incontinence is associated with the pain.  She has been taking ibuprofen 800mg  as needed, which provides some relief, but she is uncomfortable with the prolonged use. Stretching also helps alleviate the pain. Her work requires her to be on her feet all day, which exacerbates the pain if she walks or sits for too long.  No neck pain is reported.       The following portions of the patient's history were reviewed and updated as appropriate: past medical history, past surgical history, family history, social history, allergies, medications, and problem list.    Patient Active Problem List   Diagnosis Date Noted   Vitamin B12 deficiency 03/05/2024   Injury of right hand 03/05/2024   Prediabetes 11/20/2023   Vitamin D  deficiency 11/20/2023   Routine general medical examination at a health care facility 11/20/2023   Elevated blood pressure reading 10/18/2023   Anemia 10/18/2023   Hordeolum externum of left upper eyelid 05/24/2022   History of gestational diabetes 04/25/2022   Gastroesophageal reflux disease 04/25/2022   Tobacco abuse 04/25/2022   Alcohol abuse 04/25/2022   Asthma 04/26/2021   Diabetes mellitus (HCC) 04/26/2021   Obstructive sleep apnea syndrome 04/26/2021   S/P laparoscopic sleeve gastrectomy 03/08/2020   History of laparoscopic adjustable gastric banding-[facility] in [place]  2013 03/07/2020   Morbid obesity (HCC) 02/27/2020   Past Medical History:  Diagnosis Date   Abnormal Pap smear of cervix    age 71-neg colposcopy--no treatment   Arthritis of knee, left    Asthma    mild, seasonal   Diabetes mellitus without complication (HCC)    gestational only   Dysmenorrhea    History of hiatal hernia    history of   History of iron deficiency anemia    Low vitamin D  level 2020   OSA on CPAP    history of, lost weight   STD (sexually transmitted disease)    Hx HSV II   Trichomonas infection 2020   Past Surgical History:  Procedure Laterality Date   CESAREAN SECTION  2006, 2012   delivered in [place]    CHOLECYSTECTOMY  FOOT SURGERY Left    HERNIA REPAIR     KNEE SURGERY Right    x2   LAPAROSCOPIC GASTRIC BAND REMOVAL WITH LAPAROSCOPIC GASTRIC SLEEVE RESECTION N/A 03/08/2020   Procedure: LAPAROSCOPIC GASTRIC BAND REMOVAL WITH LAPAROSCOPIC GASTRIC SLEEVE RESECTION, Upper Endo, ERAS Pathway;  Surgeon: Gladis Cough, MD;  Location: [facility];  Service: General;  Laterality: N/A;   LAPAROSCOPIC GASTRIC BANDING     Family History  Problem Relation Age of Onset   Osteoarthritis Mother    Diabetes Father    Diabetes  Maternal Grandfather    Hypertension Maternal Grandfather     Current Outpatient Medications:    methocarbamol  (ROBAXIN ) 500 MG tablet, Take 1 tablet (500 mg total) by mouth every 8 (eight) hours as needed for muscle spasms., Disp: 30 tablet, Rfl: 0   naproxen  (NAPROSYN ) 500 MG tablet, Take 1 tablet (500 mg total) by mouth 2 (two) times daily with a meal for 7 days., Disp: 14 tablet, Rfl: 0   omeprazole (PRILOSEC) 20 MG capsule, Take 20 mg by mouth 2 (two) times daily., Disp: , Rfl:    predniSONE  (STERAPRED UNI-PAK 21 TAB) 10 MG (21) TBPK tablet, Take as directed on packaging., Disp: 21 tablet, Rfl: 0 No Known Allergies   ROS: A complete ROS was performed with pertinent positives/negatives noted in the HPI. The remainder of the ROS are negative.    Objective:   Today's Vitals   08/19/24 1404  BP: 128/82  Pulse: 68  SpO2: 99%  Weight: (!) 330 lb (149.7 kg)  Height: 5' 7 (1.702 m)  PainSc: 8   PainLoc: Back    GENERAL: Well-appearing, in NAD. Well nourished.  SKIN: Pink, warm and dry. No rash or ecchymosis.  NECK: Trachea midline. Full ROM w/o pain or tenderness.  RESPIRATORY: Chest wall symmetrical. Respirations even and non-labored. MSK: Muscle tone and strength appropriate for age. Joints w/o tenderness, redness, or swelling. (+) straight leg raise on LLE . TTP to left lower back EXTREMITIES: Without clubbing, cyanosis, or edema.  NEUROLOGIC: No sensory deficits.  PSYCH/MENTAL STATUS: Alert, oriented x 3. Cooperative, appropriate mood and affect.    No results found for any visits on 08/19/24.    Assessment & Plan:  Assessment and Plan    Left-sided sciatica Left-sided sciatica likely due to sciatic nerve irritation, presenting with radiating pain. Conservative management preferred. - Prescribed naproxen  500 mg twice daily for one week with food. - Prescribed methocarbamol  every eight hours as needed for pain, advised on drowsiness. - Prescribed prednisone  pack. -  Provided exercises and stretches for sciatica. - Advised alternating ice and heat application. - Instructed to avoid ibuprofen while taking naproxen . - If no improvement in 1.5 to 2 weeks, contact via portal for potential referral to orthopedics for further evaluation.       Meds ordered this encounter  Medications   naproxen  (NAPROSYN ) 500 MG tablet    Sig: Take 1 tablet (500 mg total) by mouth 2 (two) times daily with a meal for 7 days.    Dispense:  14 tablet    Refill:  0    Supervising Provider:   THOMPSON, AARON B [[id number]]   methocarbamol  (ROBAXIN ) 500 MG tablet    Sig: Take 1 tablet (500 mg total) by mouth every 8 (eight) hours as needed for muscle spasms.    Dispense:  30 tablet    Refill:  0    Supervising Provider:   SEBASTIAN BEVERLEY NOVAK [[id number]]   predniSONE  (STERAPRED UNI-PAK 21  TAB) 10 MG (21) TBPK tablet    Sig: Take as directed on packaging.    Dispense:  21 tablet    Refill:  0    Supervising Provider:   THOMPSON, AARON B [[id number]]   No orders of the defined types were placed in this encounter.  Lab Orders  No laboratory test(s) ordered today   No images are attached to the encounter or orders placed in the encounter.  Return if symptoms worsen or fail to improve.   Rosina Senters, FNP

## 2024-08-19 NOTE — Patient Instructions (Signed)
  VISIT SUMMARY: You came in today because of back pain that radiates to your left leg, which started after you jumped from a height during a trip to [place]. The pain has been getting worse since June and is particularly bad at night and in the morning. You have been taking ibuprofen, but you are concerned about using it for too long.  YOUR PLAN: -LEFT-SIDED SCIATICA: Left-sided sciatica is pain that radiates along the path of the sciatic nerve, which runs down your lower back through your hips and buttocks and down each leg. This is likely due to irritation of the sciatic nerve. You have been prescribed naproxen  500 mg to take twice daily for one week with food, and methocarbamol  to take every eight hours as needed for pain, but be aware it may cause drowsiness. You also have a prednisone  pack to take. Additionally, you should do the exercises and stretches provided, and alternate between ice and heat application. Avoid taking ibuprofen while you are on naproxen . If your symptoms do not improve in 1.5 to 2 weeks, please contact us  through the portal for a possible referral to orthopedics for further evaluation.  INSTRUCTIONS: If your symptoms do not improve in 1.5 to 2 weeks, please contact us  through the portal for a possible referral to orthopedics for further evaluation.                      Contains text generated by [employer]bridge.                                 Contains text generated by [employer]bridge.

## 2024-10-02 ENCOUNTER — Ambulatory Visit: Payer: Self-pay

## 2024-10-02 NOTE — Telephone Encounter (Signed)
 FYI Only or Action Required?: FYI only for provider.  Patient was last seen in primary care on 08/19/2024 by Billy Knee, FNP.  Called Nurse Triage reporting Sciatica.  Symptoms began several months ago.  Interventions attempted: Prescription medications: Muscle relaxer, naproxen  and Rest, hydration, or home remedies.  Symptoms are: stable.  Triage Disposition: See PCP Within 2 Weeks  Patient/caregiver understands and will follow disposition?: Yes Reason for Disposition  Back pain is a chronic symptom (recurrent or ongoing AND present > 4 weeks)  Answer Assessment - Initial Assessment Questions Takes muscle relaxers, usually finds relief but not today  1. ONSET: When did the pain begin? (e.g., minutes, hours, days)     Since June  2. LOCATION: Where does it hurt? (upper, mid or lower back)     Lower back, left hip  3. SEVERITY: How bad is the pain?  (e.g., Scale 1-10; mild, moderate, or severe)     8/10  4. PATTERN: Is the pain constant? (e.g., yes, no; constant, intermittent)      Typically comes and goes, today it has been constant  5. RADIATION: Does the pain shoot into your legs or somewhere else?     Sometimes shoots down to the back of left knee  6. CAUSE:  What do you think is causing the back pain?      Sciatica  7. BACK OVERUSE:  Any recent lifting of heavy objects, strenuous work or exercise?     After jumping 20 feet into water, experiencing this pain  8. MEDICINES: What have you taken so far for the pain? (e.g., nothing, acetaminophen , NSAIDS)     Muscle relaxer, naproxen   9. NEUROLOGIC SYMPTOMS: Do you have any weakness, numbness, or problems with bowel/bladder control?     Denies  10. OTHER SYMPTOMS: Do you have any other symptoms? (e.g., fever, abdomen pain, burning with urination, blood in urine)       Denies  Protocols used: Back Pain-A-AH  Copied from CRM #[id number]. Topic: Clinical - Red Word Triage >> Oct 02, 2024  3:26 PM  Thersia BROCKS wrote: [facility] that prompted transfer to Nurse Triage: Patient called in regarding her Sciatica , was seen for it a month ago and was prescribed medication for it , patient stated she isnt getting any better , still in pain

## 2024-10-08 ENCOUNTER — Ambulatory Visit: Admitting: Nurse Practitioner

## 2024-10-08 ENCOUNTER — Ambulatory Visit (INDEPENDENT_AMBULATORY_CARE_PROVIDER_SITE_OTHER)

## 2024-10-08 ENCOUNTER — Other Ambulatory Visit (HOSPITAL_COMMUNITY)

## 2024-10-08 ENCOUNTER — Encounter: Payer: Self-pay | Admitting: Nurse Practitioner

## 2024-10-08 VITALS — BP 134/90 | HR 74 | Temp 96.2°F | Wt 330.2 lb

## 2024-10-08 DIAGNOSIS — M5442 Lumbago with sciatica, left side: Secondary | ICD-10-CM | POA: Diagnosis not present

## 2024-10-08 DIAGNOSIS — G8929 Other chronic pain: Secondary | ICD-10-CM | POA: Diagnosis not present

## 2024-10-08 MED ORDER — KETOROLAC TROMETHAMINE 60 MG/2ML IM SOLN
30.0000 mg | Freq: Once | INTRAMUSCULAR | Status: AC
  Administered 2024-10-08: 30 mg via INTRAMUSCULAR

## 2024-10-08 MED ORDER — GABAPENTIN 300 MG PO CAPS
300.0000 mg | ORAL_CAPSULE | Freq: Every day | ORAL | 1 refills | Status: DC
Start: 2024-10-08 — End: 2024-10-08

## 2024-10-08 MED ORDER — GABAPENTIN 300 MG PO CAPS: 300.0000 mg | ORAL_CAPSULE | Freq: Every day | ORAL | 1 refills | Status: DC

## 2024-10-08 NOTE — Patient Instructions (Signed)
 It was great to see you!  Start gabapentin 1 capsule at bedtime   We are getting an xray of your back  I have placed  a referral to PT - they will call   Let's follow-up in 6 weeks, sooner if you have concerns.  If a referral was placed today, you will be contacted for an appointment. Please note that routine referrals can sometimes take up to 3-4 weeks to process. Please call our office if you haven't heard anything after this time frame.  Take care,  Tinnie Harada, NP

## 2024-10-08 NOTE — Progress Notes (Unsigned)
   Established Patient Office Visit  Subjective   Patient ID: Julia Davis, female    DOB: 1986-01-30  Age: 38 y.o. MRN: [id number]  Chief Complaint  Patient presents with   Sciatica    Pt presents with sciatic pain in her back, lower back Pain is a 4 or 5, radiates toward her tailbone, hips and thigh    HPI Discussed the use of AI scribe software for clinical note transcription with the patient, who gave verbal consent to proceed.  History of Present Illness   Julia Davis is a 38 year old female who presents with persistent back pain radiating to the left thigh.  She has experienced persistent back pain since June after jumping from a height of twenty feet into water in [place]. The pain began a day or two after the incident and radiates from her lower back to the left thigh. At its worst, the pain is ten out of ten, currently a four out of ten. In September, she was prescribed a steroid pack and muscle relaxers, which provided temporary relief. She takes methocarbamol  (Robaxin ) as needed, which is beneficial, but not daily. She is also taking ibuprofen which does help. The pain worsens in the morning, with bending over, and after prolonged walking at work. No fevers or incontinence. She has not engaged in physical therapy but has visited a stretch zone, which provided temporary relief.      {History (Optional):23778}  ROS See pertinent positives and negatives per HPI.    Objective:     BP (!) 153/101   Pulse 74   Temp (!) 96.2 F (35.7 C)   Wt (!) 330 lb 3.2 oz (149.8 kg)   SpO2 98%   BMI 51.72 kg/m  {Vitals History (Optional):23777}  Physical Exam Vitals and nursing note reviewed.  Constitutional:      General: She is not in acute distress.    Appearance: Normal appearance.  HENT:     Head: Normocephalic.  Eyes:     Conjunctiva/sclera: Conjunctivae normal.  Cardiovascular:     Rate and Rhythm: Normal rate and regular rhythm.     Pulses: Normal  pulses.     Heart sounds: Normal heart sounds.  Pulmonary:     Effort: Pulmonary effort is normal.     Breath sounds: Normal breath sounds.  Musculoskeletal:     Cervical back: Normal range of motion.  Skin:    General: Skin is warm.  Neurological:     General: No focal deficit present.     Mental Status: She is alert and oriented to person, place, and time.  Psychiatric:        Mood and Affect: Mood normal.        Behavior: Behavior normal.        Thought Content: Thought content normal.        Judgment: Judgment normal.      No results found for any visits on 10/08/24.  {Labs (Optional):23779}  The 10-year ASCVD risk score (Arnett DK, et al., 2019) is: 2.3%    Assessment & Plan:   Problem List Items Addressed This Visit   None   No follow-ups on file.    Tinnie DELENA Harada, NP

## 2024-10-09 ENCOUNTER — Ambulatory Visit: Payer: Self-pay | Admitting: Nurse Practitioner

## 2024-10-09 NOTE — Assessment & Plan Note (Signed)
 Chronic, not controlled. Chronic low back pain with left-sided radiculopathy since June. Pain level is currently 4/10, previously reaching 10/10. No red flags on exam. Order a lumbar spine x-ray. Prescribe gabapentin 300mg , 1 capsule at bedtime. Refer to physical therapy and provide stretching exercises. Administer toradol  30mg  IM for immediate pain relief. Consider MRI and specialist referral if pain persists.

## 2024-10-15 ENCOUNTER — Telehealth: Payer: Self-pay | Admitting: Family Medicine

## 2024-10-15 NOTE — Telephone Encounter (Signed)
Patient called to verify appointment 

## 2024-10-29 ENCOUNTER — Ambulatory Visit: Attending: Nurse Practitioner

## 2024-10-29 DIAGNOSIS — M5459 Other low back pain: Secondary | ICD-10-CM | POA: Insufficient documentation

## 2024-10-29 DIAGNOSIS — M5442 Lumbago with sciatica, left side: Secondary | ICD-10-CM | POA: Insufficient documentation

## 2024-10-29 DIAGNOSIS — M5417 Radiculopathy, lumbosacral region: Secondary | ICD-10-CM | POA: Diagnosis present

## 2024-10-29 DIAGNOSIS — G8929 Other chronic pain: Secondary | ICD-10-CM | POA: Diagnosis not present

## 2024-10-29 DIAGNOSIS — M6281 Muscle weakness (generalized): Secondary | ICD-10-CM | POA: Diagnosis present

## 2024-10-29 NOTE — Patient Instructions (Incomplete)

## 2024-10-29 NOTE — Progress Notes (Deleted)
 PATIENT: Julia Davis DOB: [birth year]  REASON FOR VISIT: follow up HISTORY FROM: patient  No chief complaint on file.    HISTORY OF PRESENT ILLNESS:  10/29/24 ALL:  Julia Davis returns for follow up for OSA on CPAP. She was last seen by me 03/2024 and having a difficult time tolerating therapy. Since,     03/27/2024 ALL:  Julia Davis is a 38 y.o. female here today for follow up for OSA on CPAP. She was seen in consult with Dr Taras 11/2023 for history of OSA previously on CPAP but stopped following weight loss. She had gained weight back and complained of fatigue.  HST 11/2023 showed overall mild obstructive sleep apnea - by number of events - with a total AHI of 10.5/hour, but significant oxygen desaturation and a mean oxygen saturation of only 89.8% for the night. This raises the possibility of underlying obesity hypoventilation syndrome. AutoPAP advised. Since, she reports having difficulty adjusting to therapy. She is wearing a FFM and does not feel it is comfortable. She is constantly adjusting it at night. She was previously on CPAP therapy and had similar concerns. She was not consistent with use. She is willing to continue therapy. She continues to work closely with PCP and bariatrics for weight management.     HISTORY: (copied from Dr Atahr's previous note)  Dear Dr. Stevie,    I saw your patient, Julia Davis, upon your kind request in my sleep clinic today for initial consultation of her sleep disorder, in particular, evaluation of her prior diagnosis of obstructive sleep apnea.  The patient is unaccompanied today.  As you know, [name] is a 38 year old female with an underlying medical history of morbid obesity with a BMI of over 45, status post gastric band in 2013 with subsequent removal and conversion to gastric sleeve in 2021, reflux disease, hypertension, hiatal hernia, low vitamin D , asthma, and arthritis, who was diagnosed with obstructive sleep apnea  in or around 2013.  As she recalls, she had mild or mild to moderate obstructive apnea and used a CPAP machine at the time.  After she achieved weight loss she stopped using her machine.  However, she has had significant weight fluctuation and gained most of her weight back that she had lost after the gastric band and after her sleeve gastrectomy she had lost about 20 to 25 pounds but gained most of it back.  She is working with a health and safety inspector.  She has also established with a veterinary surgeon for stress management.  Prior sleep study results are not available for my review today.  She had testing when she was still residing in [place] .  Her Epworth sleepiness score is 6 out of 24, fatigue severity score is 43 out of 63.  She has difficulty staying asleep.  I reviewed your office note from 10/17/2023.  She is single and lives with her 43 year old son.  She has a 55 year old son lives in [place] .  She is not aware of any family history of sleep apnea.  She recently started taking phentermine  per PCP.  She started taking hydrochlorothiazide per GYN.  She works as a optometrist.  She quit cigarette smoking about a year ago but does vape daily.  She drinks alcohol daily in the form of the 3 drinks, as late as 9 PM.  She has nocturia about once per average night and denies recurrent nocturnal or morning headaches.  Bedtime is between 10 and 10:30 PM.  She has difficulty maintaining sleep.  Rise  time is typically between 6:30 AM and 7 AM but often she is awake earlier than that.  They have no pets in the household. She would be willing to get reevaluated for sleep apnea and consider treatment with a PAP machine again.   REVIEW OF SYSTEMS: Out of a complete 14 system review of symptoms, the patient complains only of the following symptoms, fatigue and all other reviewed systems are negative.  ESS: 4/24, previously 6/24  ALLERGIES: No Known Allergies  HOME MEDICATIONS: Outpatient Medications Prior to Visit   Medication Sig Dispense Refill   gabapentin (NEURONTIN) 300 MG capsule Take 1 capsule (300 mg total) by mouth at bedtime. 30 capsule 1   methocarbamol  (ROBAXIN ) 500 MG tablet Take 1 tablet (500 mg total) by mouth every 8 (eight) hours as needed for muscle spasms. 30 tablet 0   omeprazole (PRILOSEC) 20 MG capsule Take 20 mg by mouth 2 (two) times daily.     No facility-administered medications prior to visit.    PAST MEDICAL HISTORY: Past Medical History:  Diagnosis Date   Abnormal Pap smear of cervix    age 7-neg colposcopy--no treatment   Arthritis of knee, left    Asthma    mild, seasonal   Diabetes mellitus without complication (HCC)    gestational only   Dysmenorrhea    History of hiatal hernia    history of   History of iron deficiency anemia    Low vitamin D  level 2020   OSA on CPAP    history of, lost weight   STD (sexually transmitted disease)    Hx HSV II   Trichomonas infection 2020    PAST SURGICAL HISTORY: Past Surgical History:  Procedure Laterality Date   CESAREAN SECTION  2006, 2012   delivered in [place]    CHOLECYSTECTOMY     FOOT SURGERY Left    HERNIA REPAIR     KNEE SURGERY Right    x2   LAPAROSCOPIC GASTRIC BAND REMOVAL WITH LAPAROSCOPIC GASTRIC SLEEVE RESECTION N/A 03/08/2020   Procedure: LAPAROSCOPIC GASTRIC BAND REMOVAL WITH LAPAROSCOPIC GASTRIC SLEEVE RESECTION, Upper Endo, ERAS Pathway;  Surgeon: Gladis Cough, MD;  Location: [facility];  Service: General;  Laterality: N/A;   LAPAROSCOPIC GASTRIC BANDING      FAMILY HISTORY: Family History  Problem Relation Age of Onset   Osteoarthritis Mother    Diabetes Father    Diabetes Maternal Grandfather    Hypertension Maternal Grandfather     SOCIAL HISTORY: Social History   Socioeconomic History   Marital status: Single    Spouse name: Not on file   Number of children: Not on file   Years of education: Not on file   Highest education level: Not on file  Occupational History   Not on  file  Tobacco Use   Smoking status: Former    Current packs/day: 0.25    Types: Cigarettes   Smokeless tobacco: Current   Tobacco comments:    Vapes Daily  Vaping Use   Vaping status: Every Day   Substances: Nicotine, Flavoring  Substance and Sexual Activity   Alcohol use: Yes    Alcohol/week: 5.0 standard drinks of alcohol    Types: 5 Glasses of wine per week    Comment: Occassionally.   Drug use: Not Currently    Types: Marijuana    Comment: Occassionally.   Sexual activity: Yes    Birth control/protection: I.U.D.  Other Topics Concern   Not on file  Social History Narrative   Caffiene :  oaccasional   Works:  optometrist (daytime)   Live home son.    Social Drivers of Corporate Investment Banker Strain: Not on file  Food Insecurity: Not on file  Transportation Needs: Not on file  Physical Activity: Not on file  Stress: Not on file  Social Connections: Unknown (04/28/2022)   Received from [facility]   Social Network    Social Network: Not on file  Intimate Partner Violence: Unknown (03/20/2022)   Received from [facility]   HITS    Physically Hurt: Not on file    Insult or Talk Down To: Not on file    Threaten Physical Harm: Not on file    Scream or Curse: Not on file     PHYSICAL EXAM  There were no vitals filed for this visit.  There is no height or weight on file to calculate BMI.  Generalized: Well developed, in no acute distress  Cardiology: normal rate and rhythm, no murmur noted Respiratory: clear to auscultation bilaterally  Neurological examination  Mentation: Alert oriented to time, place, history taking. Follows all commands speech and language fluent Cranial nerve II-XII: Pupils were equal round reactive to light. Extraocular movements were full, visual field were full  Motor: The motor testing reveals 5 over 5 strength of all 4 extremities. Good symmetric motor tone is noted throughout.  Gait and station: Gait is normal.     DIAGNOSTIC DATA (LABS, IMAGING, TESTING) - I reviewed patient records, labs, notes, testing and imaging myself where available.      No data to display           Lab Results  Component Value Date   WBC 4.7 10/18/2023   HGB 9.2 (L) 10/18/2023   HCT 30.3 (L) 10/18/2023   MCV 81.7 10/18/2023   PLT 321.0 10/18/2023      Component Value Date/Time   NA 142 10/18/2023 0843   NA 141 01/15/2019 0936   K 4.0 10/18/2023 0843   CL 105 10/18/2023 0843   CO2 28 10/18/2023 0843   GLUCOSE 88 10/18/2023 0843   BUN 13 10/18/2023 0843   BUN 10 01/15/2019 0936   CREATININE 0.86 10/18/2023 0843   CALCIUM 9.0 10/18/2023 0843   PROT 7.0 10/18/2023 0843   PROT 6.9 01/15/2019 0936   ALBUMIN 3.9 10/18/2023 0843   ALBUMIN 4.4 01/15/2019 0936   AST 19 10/18/2023 0843   ALT 15 10/18/2023 0843   ALKPHOS 52 10/18/2023 0843   BILITOT 0.5 10/18/2023 0843   BILITOT 0.5 01/15/2019 0936   GFRNONAA >60 09/20/2023 1352   GFRAA >60 03/08/2020 1105   Lab Results  Component Value Date   CHOL 168 10/18/2023   HDL 61.70 10/18/2023   LDLCALC 93 10/18/2023   TRIG 68.0 10/18/2023   CHOLHDL 3 10/18/2023   Lab Results  Component Value Date   HGBA1C 6.0 10/18/2023   Lab Results  Component Value Date   VITAMINB12 226 10/18/2023   Lab Results  Component Value Date   TSH 2.31 04/28/2022     ASSESSMENT AND PLAN 38 y.o. year old female  has a past medical history of Abnormal Pap smear of cervix, Arthritis of knee, left, Asthma, Diabetes mellitus without complication (HCC), Dysmenorrhea, History of hiatal hernia, History of iron deficiency anemia, Low vitamin D  level (2020), OSA on CPAP, STD (sexually transmitted disease), and Trichomonas infection (2020). here with   No diagnosis found.     Talar Schauf has had difficulty adjusting to CPAP therapy.  Compliance report reveals very little usage. We have reviewed sleep study results and risks of untreated sleep apnea. She is motivated to  continue using CPAP. I will send orders for a mas refitting. She was encouraged to continue using CPAP nightly and for greater than 4 hours each night. We will update supply orders as indicated. Risks of untreated sleep apnea review and education materials provided. Healthy lifestyle habits encouraged. She will follow up in 6 months, sooner if needed. She verbalizes understanding and agreement with this plan.    No orders of the defined types were placed in this encounter.    No orders of the defined types were placed in this encounter.     Greig Forbes, FNP-C 10/29/2024, 9:35 AM [facility] [address], [address] [place], [place] [address] [phone]

## 2024-10-29 NOTE — Therapy (Signed)
 OUTPATIENT PHYSICAL THERAPY THORACOLUMBAR EVALUATION   Patient Name: Julia Davis MRN: [id number] DOB:July 21, 1986, 38 y.o., female Today's Date: 10/29/2024  END OF SESSION:  PT End of Session - 10/29/24 1710     Visit Number 1    Date for Recertification  01/07/25    Authorization Type [employer]    PT Start Time 1710    PT Stop Time 1745    PT Time Calculation (min) 35 min          Past Medical History:  Diagnosis Date   Abnormal Pap smear of cervix    age 67-neg colposcopy--no treatment   Arthritis of knee, left    Asthma    mild, seasonal   Diabetes mellitus without complication (HCC)    gestational only   Dysmenorrhea    History of hiatal hernia    history of   History of iron deficiency anemia    Low vitamin D  level 2020   OSA on CPAP    history of, lost weight   STD (sexually transmitted disease)    Hx HSV II   Trichomonas infection 2020   Past Surgical History:  Procedure Laterality Date   CESAREAN SECTION  2006, 2012   delivered in [place]    CHOLECYSTECTOMY     FOOT SURGERY Left    HERNIA REPAIR     KNEE SURGERY Right    x2   LAPAROSCOPIC GASTRIC BAND REMOVAL WITH LAPAROSCOPIC GASTRIC SLEEVE RESECTION N/A 03/08/2020   Procedure: LAPAROSCOPIC GASTRIC BAND REMOVAL WITH LAPAROSCOPIC GASTRIC SLEEVE RESECTION, Upper Endo, ERAS Pathway;  Surgeon: Gladis Cough, MD;  Location: [facility];  Service: General;  Laterality: N/A;   LAPAROSCOPIC GASTRIC BANDING     Patient Active Problem List   Diagnosis Date Noted   Chronic left-sided low back pain with left-sided sciatica 10/08/2024   Vitamin B12 deficiency 03/05/2024   Injury of right hand 03/05/2024   Prediabetes 11/20/2023   Vitamin D  deficiency 11/20/2023   Routine general medical examination at a health care facility 11/20/2023   Elevated blood pressure reading 10/18/2023   Anemia 10/18/2023   Hordeolum externum of left upper eyelid 05/24/2022   History of gestational diabetes 04/25/2022    Gastroesophageal reflux disease 04/25/2022   Tobacco abuse 04/25/2022   Alcohol abuse 04/25/2022   Asthma 04/26/2021   Obstructive sleep apnea syndrome 04/26/2021   S/P laparoscopic sleeve gastrectomy 03/08/2020   History of laparoscopic adjustable gastric banding-APL in [place]  2013 03/07/2020   Morbid obesity (HCC) 02/27/2020    PCP: Tinnie Harada  REFERRING PROVIDER: Tinnie Harada  REFERRING DIAG:  [phone] (ICD-10-CM) - Chronic left-sided low back pain with left-sided sciatica    Rationale for Evaluation and Treatment: Rehabilitation  THERAPY DIAG:  Radiculopathy, lumbosacral region  Other low back pain  Muscle weakness (generalized)  ONSET DATE: June  SUBJECTIVE:  SUBJECTIVE STATEMENT: My sciatic nerve has been bothering me since June. I was in [place] and doing cliff jumping, and I jumped 72ft and it has been hurting since.   PERTINENT HISTORY:  Julia Davis is a 38 year old female who presents with persistent back pain radiating to the left thigh. She has experienced persistent back pain since June after jumping from a height of twenty feet into water in [place]. The pain began a day or two after the incident and radiates from her lower back to the left thigh. At its worst, the pain is ten out of ten, currently a four out of ten. In September, she was prescribed a steroid pack and muscle relaxers, which provided temporary relief. She takes methocarbamol  (Robaxin ) as needed, which is beneficial, but not daily. She is also taking ibuprofen which does help. The pain worsens in the morning, with bending over, and after prolonged walking at work. No fevers or incontinence. She has not engaged in physical therapy but has visited a stretch zone, which provided temporary relief.    PAIN:  Are you having pain? Yes: NPRS scale: comes and goes, at worst 8/10 Pain location: low back, radiates into L leg Pain description: sharp, shooting, uncomfortable to walk, when not high levels of pain it is a dull, constant ache that is annoying Aggravating factors: worse in the morning, walking a lot, prolonged standing and sitting Relieving factors: medicine helps some, seat warmers in car   PRECAUTIONS: None  RED FLAGS: None   WEIGHT BEARING RESTRICTIONS: No  FALLS:  Has patient fallen in last 6 months? Yes. Number of falls 1, I have bad knees   LIVING ENVIRONMENT: Lives with: lives with their family Lives in: House/apartment Stairs: Yes: External: 12 steps; on left going up  OCCUPATION: Comptroller   PLOF: Independent  PATIENT GOALS: stop being in pain, want to be able to go back to the gym   NEXT MD VISIT: 11/24/24  OBJECTIVE:  Note: Objective measures were completed at Evaluation unless otherwise noted.  DIAGNOSTIC FINDINGS:  IMPRESSION: 1. No acute abnormality of the lumbar spine. 2. Mild disc space narrowing at L5-S1. 3. Mild facet arthrosis in the lower lumbar spine.   COGNITION: Overall cognitive status: Within functional limits for tasks assessed     SENSATION: WFL  MUSCLE LENGTH: tightness in B hamstrings and hips, as well as low back mm  POSTURE: rounded shoulders  PALPATION: TTP and pain at L1-L5, increased pain at L4-L5 and at SIJ   LUMBAR ROM:   AROM eval  Flexion WFL a little bit of pain  Extension WFL  Right lateral flexion WFL a little pain  Left lateral flexion WFL some pain  Right rotation WFL  Left rotation WFL   (Blank rows = not tested)  LOWER EXTREMITY ROM:   WNL  LOWER EXTREMITY MMT:    MMT Right eval Left eval  Hip flexion 4+ 4+  Hip extension    Hip abduction 4+ 4+  Hip adduction    Hip internal rotation    Hip external rotation    Knee flexion 4- 4  Knee extension 4+ 4+  Ankle dorsiflexion     Ankle plantarflexion    Ankle inversion    Ankle eversion     (Blank rows = not tested)  LUMBAR SPECIAL TESTS:  Straight leg raise test: Positive and Slump test: Positive  FUNCTIONAL TESTS:  5 times sit to stand: 15s   TREATMENT DATE: 10/29/24- EVAL  PATIENT EDUCATION:  Education details: POC and HEP Person educated: Patient Education method: Medical Illustrator Education comprehension: verbalized understanding and returned demonstration  HOME EXERCISE PROGRAM: Access Code: [email or web address] URL: [email or web address] Date: 10/29/2024 Prepared by: Almetta Fam  Exercises - Supine Lower Trunk Rotation  - 1 x daily - 7 x weekly - 2 sets - 10 reps - 3 hold - Supine Bridge  - 1 x daily - 7 x weekly - 2 sets - 10 reps - Supine Single Knee to Chest Stretch  - 1 x daily - 7 x weekly - 2 reps - 20 hold - Seated Sciatic Nerve Glide With Cervical Motion  - 1 x daily - 7 x weekly - 2 sets - 10 reps  ASSESSMENT:  CLINICAL IMPRESSION: Patient is a 38 y.o. female who was seen today for physical therapy evaluation and treatment for LBP. She has had chronic low back pain with left-sided radiculopathy since June, which started after she jumped off a cliff in [place]. Pain level is currently 3/10, but at worst can reach a 8/10. No red flags on exam. However, she does have classic signs of sciatica that radiate down into her LLE. With palpation she has pain with unilateral posterior to anterior glides along her lumber spine and at the sacrum. Patient will benefit from PT to address her back pain, alleviate sciatic symptoms, and increase her activity tolerance needed for work and to return to the gym.  OBJECTIVE IMPAIRMENTS: decreased activity tolerance, decreased ROM, decreased strength, impaired flexibility, obesity, and pain.   ACTIVITY LIMITATIONS:  carrying, lifting, bending, sitting, standing, squatting, and locomotion level  PARTICIPATION LIMITATIONS: cleaning, community activity, occupation, and yard work  PERSONAL FACTORS: Fitness, Past/current experiences, and Time since onset of injury/illness/exacerbation are also affecting patient's functional outcome.   REHAB POTENTIAL: Good  CLINICAL DECISION MAKING: Stable/uncomplicated  EVALUATION COMPLEXITY: Low  GOALS: Goals reviewed with patient? Yes  SHORT TERM GOALS: Target date: 12/03/24  Patient will be independent with initial HEP.  Baseline:  Goal status: INITIAL  2.  Patient will report centralization of radicular symptoms.  Baseline:  Goal status: INITIAL   LONG TERM GOALS: Target date: 01/07/25  Patient will be independent with advanced/ongoing HEP to improve outcomes and carryover.  Baseline:  Goal status: INITIAL  2.  Patient will report 50-75% improvement in low back pain to improve QOL.  Baseline:  Goal status: INITIAL  3.  Patient will demonstrate full pain free lumbar ROM to perform ADLs.   Baseline:  Goal status: INITIAL  4.  Patient will tolerate 30 min to an hour of (standing/sitting/walking) to perform work and household chores. Baseline:  Goal status: INITIAL  5.  Patient to demonstrate ability to achieve and maintain good spinal alignment/posturing and body mechanics needed to return to gym activities. Baseline:  Goal status: INITIAL   PLAN:  PT FREQUENCY: 1-2x/week  PT DURATION: 10 weeks  PLANNED INTERVENTIONS: 97110-Therapeutic exercises, 97530- Therapeutic activity, [id number]- Neuromuscular re-education, 97535- Self Care, 02859- Manual therapy, [id number]- Electrical stimulation (unattended), [phone]- Traction (mechanical), 20560 (1-2 muscles), 20561 (3+ muscles)- Dry Needling, Patient/Family education, Balance training, Taping, Joint mobilization, Spinal mobilization, Cryotherapy, and Moist heat.  PLAN FOR NEXT SESSION: manual traction, low  back and hip stretching, heat or e-stim if needed for pain    Almetta Fam, PT 10/29/2024, 5:49 PM

## 2024-11-03 ENCOUNTER — Ambulatory Visit: Admitting: Family Medicine

## 2024-11-05 ENCOUNTER — Other Ambulatory Visit: Payer: Self-pay | Admitting: Nurse Practitioner

## 2024-11-05 DIAGNOSIS — M5432 Sciatica, left side: Secondary | ICD-10-CM

## 2024-11-05 NOTE — Telephone Encounter (Unsigned)
 Copied from CRM [id number]. Topic: Clinical - Medication Refill >> Nov 05, 2024 11:24 AM Thersia C wrote: Medication: methocarbamol  (ROBAXIN ) 500 MG tablet  gabapentin  (NEURONTIN ) 300 MG capsule  Has the patient contacted their pharmacy? Yes (Agent: If no, request that the patient contact the pharmacy for the refill. If patient does not wish to contact the pharmacy document the reason why and proceed with request.) (Agent: If yes, when and what did the pharmacy advise?)  This is the patient's preferred pharmacy:    [id number] [address] [place], [place] - [address] [place] [address] Phone: [phone] Fax: [phone]  Is this the correct pharmacy for this prescription? Yes If no, delete pharmacy and type the correct one.   Has the prescription been filled recently? No  Is the patient out of the medication? Yes  Has the patient been seen for an appointment in the last year OR does the patient have an upcoming appointment? Yes  Can we respond through MyChart? Yes  Agent: Please be advised that Rx refills may take up to 3 business days. We ask that you follow-up with your pharmacy.

## 2024-11-06 MED ORDER — METHOCARBAMOL 500 MG PO TABS: 500.0000 mg | ORAL_TABLET | Freq: Three times a day (TID) | ORAL | 0 refills | Status: AC | PRN

## 2024-11-06 MED ORDER — GABAPENTIN 300 MG PO CAPS: 300.0000 mg | ORAL_CAPSULE | Freq: Every day | ORAL | 1 refills | Status: DC

## 2024-11-06 NOTE — Telephone Encounter (Signed)
 Requesting: methocarbamol  (ROBAXIN ) 500 MG tablet , gabapentin (NEURONTIN) 300 MG capsule  Last Visit: 10/08/2024 Next Visit: 11/24/2024 Last Refill: 08/19/2024 by Rosina Senters, NP, 10/08/2024  Please Advise

## 2024-11-19 ENCOUNTER — Ambulatory Visit: Attending: Nurse Practitioner

## 2024-11-19 DIAGNOSIS — M6281 Muscle weakness (generalized): Secondary | ICD-10-CM | POA: Diagnosis present

## 2024-11-19 DIAGNOSIS — M5459 Other low back pain: Secondary | ICD-10-CM | POA: Diagnosis present

## 2024-11-19 DIAGNOSIS — M5417 Radiculopathy, lumbosacral region: Secondary | ICD-10-CM | POA: Diagnosis present

## 2024-11-19 NOTE — Therapy (Signed)
 OUTPATIENT PHYSICAL THERAPY THORACOLUMBAR TREATMENT   Patient Name: Julia Davis MRN: [id number] DOB:1986/05/26, 38 y.o., female Today's Date: 11/19/2024  END OF SESSION:  PT End of Session - 11/19/24 1711     Visit Number 2    Date for Recertification  01/07/25    Authorization Type [employer]    PT Start Time 1709    PT Stop Time 1745    PT Time Calculation (min) 36 min    Activity Tolerance Patient tolerated treatment well    Behavior During Therapy WFL for tasks assessed/performed          Past Medical History:  Diagnosis Date   Abnormal Pap smear of cervix    age 11-neg colposcopy--no treatment   Arthritis of knee, left    Asthma    mild, seasonal   Diabetes mellitus without complication (HCC)    gestational only   Dysmenorrhea    History of hiatal hernia    history of   History of iron deficiency anemia    Low vitamin D  level 2020   OSA on CPAP    history of, lost weight   STD (sexually transmitted disease)    Hx HSV II   Trichomonas infection 2020   Past Surgical History:  Procedure Laterality Date   CESAREAN SECTION  2006, 2012   delivered in [place]    CHOLECYSTECTOMY     FOOT SURGERY Left    HERNIA REPAIR     KNEE SURGERY Right    x2   LAPAROSCOPIC GASTRIC BAND REMOVAL WITH LAPAROSCOPIC GASTRIC SLEEVE RESECTION N/A 03/08/2020   Procedure: LAPAROSCOPIC GASTRIC BAND REMOVAL WITH LAPAROSCOPIC GASTRIC SLEEVE RESECTION, Upper Endo, ERAS Pathway;  Surgeon: Gladis Cough, MD;  Location: [facility];  Service: General;  Laterality: N/A;   LAPAROSCOPIC GASTRIC BANDING     Patient Active Problem List   Diagnosis Date Noted   Chronic left-sided low back pain with left-sided sciatica 10/08/2024   Vitamin B12 deficiency 03/05/2024   Injury of right hand 03/05/2024   Prediabetes 11/20/2023   Vitamin D  deficiency 11/20/2023   Routine general medical examination at a health care facility 11/20/2023   Elevated blood pressure reading 10/18/2023   Anemia  10/18/2023   Hordeolum externum of left upper eyelid 05/24/2022   History of gestational diabetes 04/25/2022   Gastroesophageal reflux disease 04/25/2022   Tobacco abuse 04/25/2022   Alcohol abuse 04/25/2022   Asthma 04/26/2021   Obstructive sleep apnea syndrome 04/26/2021   S/P laparoscopic sleeve gastrectomy 03/08/2020   History of laparoscopic adjustable gastric banding-[facility] in [place]  2013 03/07/2020   Morbid obesity (HCC) 02/27/2020    PCP: Tinnie Harada  REFERRING PROVIDER: Tinnie Harada  REFERRING DIAG:  [phone] (ICD-10-CM) - Chronic left-sided low back pain with left-sided sciatica    Rationale for Evaluation and Treatment: Rehabilitation  THERAPY DIAG:  Radiculopathy, lumbosacral region  Other low back pain  Muscle weakness (generalized)  ONSET DATE: June  SUBJECTIVE:  SUBJECTIVE STATEMENT: I have tried some of the HEP and it relieves some of the pain I feel in the morning which is when I feel the most stiffness.    Eval: My sciatic nerve has been bothering me since June. I was in [place] and doing cliff jumping, and I jumped 32ft and it has been hurting since.   PERTINENT HISTORY:  Julia Davis is a 38 year old female who presents with persistent back pain radiating to the left thigh. She has experienced persistent back pain since June after jumping from a height of twenty feet into water in [place]. The pain began a day or two after the incident and radiates from her lower back to the left thigh. At its worst, the pain is ten out of ten, currently a four out of ten. In September, she was prescribed a steroid pack and muscle relaxers, which provided temporary relief. She takes methocarbamol  (Robaxin ) as needed, which is beneficial, but not daily. She is also  taking ibuprofen which does help. The pain worsens in the morning, with bending over, and after prolonged walking at work. No fevers or incontinence. She has not engaged in physical therapy but has visited a stretch zone, which provided temporary relief.   PAIN:  Are you having pain? Yes: NPRS scale: comes and goes, at worst 8/10 Pain location: low back, radiates into L leg Pain description: sharp, shooting, uncomfortable to walk, when not high levels of pain it is a dull, constant ache that is annoying Aggravating factors: worse in the morning, walking a lot, prolonged standing and sitting Relieving factors: medicine helps some, seat warmers in car   PRECAUTIONS: None  RED FLAGS: None   WEIGHT BEARING RESTRICTIONS: No  FALLS:  Has patient fallen in last 6 months? Yes. Number of falls 1, I have bad knees   LIVING ENVIRONMENT: Lives with: lives with their family Lives in: House/apartment Stairs: Yes: External: 12 steps; on left going up  OCCUPATION: Comptroller   PLOF: Independent  PATIENT GOALS: stop being in pain, want to be able to go back to the gym   NEXT MD VISIT: 11/24/24  OBJECTIVE:  Note: Objective measures were completed at Evaluation unless otherwise noted.  DIAGNOSTIC FINDINGS:  IMPRESSION: 1. No acute abnormality of the lumbar spine. 2. Mild disc space narrowing at L5-S1. 3. Mild facet arthrosis in the lower lumbar spine.   COGNITION: Overall cognitive status: Within functional limits for tasks assessed     SENSATION: WFL  MUSCLE LENGTH: tightness in B hamstrings and hips, as well as low back mm  POSTURE: rounded shoulders  PALPATION: TTP and pain at L1-L5, increased pain at L4-L5 and at SIJ   LUMBAR ROM:   AROM eval  Flexion WFL a little bit of pain  Extension WFL  Right lateral flexion WFL a little pain  Left lateral flexion WFL some pain  Right rotation WFL  Left rotation WFL   (Blank rows = not tested)  LOWER EXTREMITY ROM:    WNL  LOWER EXTREMITY MMT:    MMT Right eval Left eval  Hip flexion 4+ 4+  Hip extension    Hip abduction 4+ 4+  Hip adduction    Hip internal rotation    Hip external rotation    Knee flexion 4- 4  Knee extension 4+ 4+  Ankle dorsiflexion    Ankle plantarflexion    Ankle inversion    Ankle eversion     (Blank rows = not tested)  LUMBAR SPECIAL TESTS:  Straight leg raise test: Positive and Slump test: Positive  FUNCTIONAL TESTS:  5 times sit to stand: 15s   TREATMENT DATE:  11/19/24 NuStep L5 x6 min  Cat/Cow Stretch 1x15 Stability ball rollouts for low back stretch Glute Bridges 2x10 Clam Shells 2x10 rtband  Toe Taps on 6 Step 2x10 per  Cable lat pulldowns 2x10 30# Seated Rows 2x10 35#  10/29/24- EVAL                                                                                                                                 PATIENT EDUCATION:  Education details: POC and HEP Person educated: Patient Education method: Medical Illustrator Education comprehension: verbalized understanding and returned demonstration  HOME EXERCISE PROGRAM: Access Code: [email or web address] URL: [email or web address] Date: 10/29/2024 Prepared by: Almetta Fam  Exercises - Supine Lower Trunk Rotation  - 1 x daily - 7 x weekly - 2 sets - 10 reps - 3 hold - Supine Bridge  - 1 x daily - 7 x weekly - 2 sets - 10 reps - Supine Single Knee to Chest Stretch  - 1 x daily - 7 x weekly - 2 reps - 20 hold - Seated Sciatic Nerve Glide With Cervical Motion  - 1 x daily - 7 x weekly - 2 sets - 10 reps  ASSESSMENT:  CLINICAL IMPRESSION: Pt exhibited decreased back pain today allowing capacity for more light strengthening exercises. Pt participated in light stretching prior to activity today and required minimal rest periods without exhibiting any fatigue. Plan to continue strengthening lower back musculature and stretching to decrease back stiffness.   Patient is a 38 y.o.  female who was seen today for physical therapy evaluation and treatment for LBP. She has had chronic low back pain with left-sided radiculopathy since June, which started after she jumped off a cliff in [place]. Pain level is currently 3/10, but at worst can reach a 8/10. No red flags on exam. However, she does have classic signs of sciatica that radiate down into her LLE. With palpation she has pain with unilateral posterior to anterior glides along her lumber spine and at the sacrum. Patient will benefit from PT to address her back pain, alleviate sciatic symptoms, and increase her activity tolerance needed for work and to return to the gym.  OBJECTIVE IMPAIRMENTS: decreased activity tolerance, decreased ROM, decreased strength, impaired flexibility, obesity, and pain.   ACTIVITY LIMITATIONS: carrying, lifting, bending, sitting, standing, squatting, and locomotion level  PARTICIPATION LIMITATIONS: cleaning, community activity, occupation, and yard work  PERSONAL FACTORS: Fitness, Past/current experiences, and Time since onset of injury/illness/exacerbation are also affecting patient's functional outcome.   REHAB POTENTIAL: Good  CLINICAL DECISION MAKING: Stable/uncomplicated  EVALUATION COMPLEXITY: Low  GOALS: Goals reviewed with patient? Yes  SHORT TERM GOALS: Target date: 12/03/24  Patient will be independent with initial HEP.  Baseline:  Goal status: IN PROGRESS 11/19/24  2.  Patient will  report centralization of radicular symptoms.  Baseline:  Goal status: IN PROGRESS comes and goes 11/19/24   LONG TERM GOALS: Target date: 01/07/25  Patient will be independent with advanced/ongoing HEP to improve outcomes and carryover.  Baseline:  Goal status: INITIAL  2.  Patient will report 50-75% improvement in low back pain to improve QOL.  Baseline:  Goal status: INITIAL  3.  Patient will demonstrate full pain free lumbar ROM to perform ADLs.   Baseline:  Goal status: INITIAL  4.   Patient will tolerate 30 min to an hour of (standing/sitting/walking) to perform work and household chores. Baseline:  Goal status: INITIAL  5.  Patient to demonstrate ability to achieve and maintain good spinal alignment/posturing and body mechanics needed to return to gym activities. Baseline:  Goal status: INITIAL   PLAN:  PT FREQUENCY: 1-2x/week  PT DURATION: 10 weeks  PLANNED INTERVENTIONS: 97110-Therapeutic exercises, 97530- Therapeutic activity, [id number]- Neuromuscular re-education, 97535- Self Care, [id number]- Manual therapy, [id number]- Electrical stimulation (unattended), [phone]- Traction (mechanical), 20560 (1-2 muscles), 20561 (3+ muscles)- Dry Needling, Patient/Family education, Balance training, Taping, Joint mobilization, Spinal mobilization, Cryotherapy, and Moist heat.  PLAN FOR NEXT SESSION: manual traction, low back and hip stretching, heat or e-stim if needed for pain    Thersia Alder, Student-PT 11/19/2024, 5:14 PM

## 2024-11-24 ENCOUNTER — Ambulatory Visit (INDEPENDENT_AMBULATORY_CARE_PROVIDER_SITE_OTHER): Admitting: Nurse Practitioner

## 2024-11-24 ENCOUNTER — Encounter: Payer: Self-pay | Admitting: Nurse Practitioner

## 2024-11-24 VITALS — BP 136/90 | HR 80 | Temp 97.5°F | Ht 67.0 in | Wt 331.0 lb

## 2024-11-24 DIAGNOSIS — Z1231 Encounter for screening mammogram for malignant neoplasm of breast: Secondary | ICD-10-CM

## 2024-11-24 DIAGNOSIS — M5442 Lumbago with sciatica, left side: Secondary | ICD-10-CM

## 2024-11-24 DIAGNOSIS — G8929 Other chronic pain: Secondary | ICD-10-CM

## 2024-11-24 MED ORDER — GABAPENTIN 300 MG PO CAPS: 300.0000 mg | ORAL_CAPSULE | Freq: Two times a day (BID) | ORAL | 1 refills | Status: AC

## 2024-11-24 NOTE — Patient Instructions (Signed)
 It was great to see you!  Increase your gabapentin  to twice a day for pain  I have ordered a MRI - they will call to schedule   Let's get you scheduled for a mammogram.   Let's follow-up in 2 months, sooner if you have concerns.  If a referral was placed today, you will be contacted for an appointment. Please note that routine referrals can sometimes take up to 3-4 weeks to process. Please call our office if you haven't heard anything after this time frame.  Take care,  Tinnie Harada, NP

## 2024-11-24 NOTE — Progress Notes (Signed)
 Established Patient Office Visit  Subjective   Patient ID: Julia Davis, female    DOB: [birth year]  Age: 38 y.o. MRN: [id number]  Chief Complaint  Patient presents with   Back Pain    Follow up, concerns with radiating pain   HPI:  Discussed the use of AI scribe software for clinical note transcription with the patient, who gave verbal consent to proceed.  History of Present Illness   Julia Davis is a 39 year old female who presents with persistent back pain.  She reports chronic back pain that has not improved with nighttime gabapentin . The pain is worse in the morning, fluctuates during the day, and increases with movement or walking. It radiates down her left leg to the mid-thigh. She recently started physical therapy and had a spine x-ray. Pain is 2 out of 10 today but can flare with movement. She denies drowsiness from gabapentin  when taken at night.       ROS See pertinent positives and negatives per HPI.    Objective:     BP (!) 136/90 (BP Location: Left Arm, Patient Position: Sitting, Cuff Size: Normal)   Pulse 80   Temp (!) 97.5 F (36.4 C)   Ht 5' 7 (1.702 m)   Wt (!) 331 lb (150.1 kg)   LMP 11/08/2024 (Exact Date)   SpO2 95%   BMI 51.84 kg/m    Physical Exam Vitals and nursing note reviewed.  Constitutional:      General: She is not in acute distress.    Appearance: Normal appearance.  HENT:     Head: Normocephalic.  Eyes:     Conjunctiva/sclera: Conjunctivae normal.  Cardiovascular:     Rate and Rhythm: Normal rate and regular rhythm.     Pulses: Normal pulses.     Heart sounds: Normal heart sounds.  Pulmonary:     Effort: Pulmonary effort is normal.  Musculoskeletal:        General: No swelling or tenderness.     Cervical back: Normal range of motion.     Comments: Straight leg raise negative bilaterally   Skin:    General: Skin is warm.  Neurological:     General: No focal deficit present.     Mental Status: She is alert and  oriented to person, place, and time.  Psychiatric:        Mood and Affect: Mood normal.        Behavior: Behavior normal.        Thought Content: Thought content normal.        Judgment: Judgment normal.      Assessment & Plan:   Problem List Items Addressed This Visit       Nervous and Auditory   Chronic left-sided low back pain with left-sided sciatica - Primary   Chronic back pain with left-sided sciatica improving, but not controlled, rated 2/10, worsens in the morning and with movement. Pain radiates to mid-thigh. Gabapentin  provides partial relief. X-ray shows mild disc narrowing and arthritis, suggesting possible nerve impingement. Ordered MRI of lumbar spine to assess for nerve impingement. Increased gabapentin  to 300mg  twice daily. Continue physical therapy.      Relevant Medications   gabapentin  (NEURONTIN ) 300 MG capsule   Other Relevant Orders   MR Lumbar Spine Wo Contrast   Other Visit Diagnoses       Encounter for screening mammogram for malignant neoplasm of breast       Mammogram ordered today   Relevant Orders  MM 3D SCREENING MAMMOGRAM BILATERAL BREAST      Return in about 2 months (around 01/25/2025) for back pain.    Tinnie DELENA Harada, NP

## 2024-11-24 NOTE — Assessment & Plan Note (Signed)
 Chronic back pain with left-sided sciatica improving, but not controlled, rated 2/10, worsens in the morning and with movement. Pain radiates to mid-thigh. Gabapentin  provides partial relief. X-ray shows mild disc narrowing and arthritis, suggesting possible nerve impingement. Ordered MRI of lumbar spine to assess for nerve impingement. Increased gabapentin  to 300mg  twice daily. Continue physical therapy.

## 2024-11-25 ENCOUNTER — Ambulatory Visit

## 2024-11-25 DIAGNOSIS — M6281 Muscle weakness (generalized): Secondary | ICD-10-CM

## 2024-11-25 DIAGNOSIS — M5417 Radiculopathy, lumbosacral region: Secondary | ICD-10-CM

## 2024-11-25 DIAGNOSIS — M5459 Other low back pain: Secondary | ICD-10-CM

## 2024-11-25 NOTE — Therapy (Signed)
 OUTPATIENT PHYSICAL THERAPY THORACOLUMBAR TREATMENT   Patient Name: Julia Davis MRN: [id number] DOB:1986/10/29, 38 y.o., female Today's Date: 11/25/2024  END OF SESSION:  PT End of Session - 11/25/24 1717     Visit Number 3    Date for Recertification  01/07/25    Authorization Type [employer]    PT Start Time 1715    PT Stop Time 1755    PT Time Calculation (min) 40 min    Activity Tolerance Patient tolerated treatment well    Behavior During Therapy WFL for tasks assessed/performed         Past Medical History:  Diagnosis Date   Abnormal Pap smear of cervix    age 87-neg colposcopy--no treatment   Arthritis of knee, left    Asthma    mild, seasonal   Diabetes mellitus without complication (HCC)    gestational only   Dysmenorrhea    History of hiatal hernia    history of   History of iron deficiency anemia    Low vitamin D  level 2020   OSA on CPAP    history of, lost weight   STD (sexually transmitted disease)    Hx HSV II   Trichomonas infection 2020   Past Surgical History:  Procedure Laterality Date   CESAREAN SECTION  2006, 2012   delivered in [place]    CHOLECYSTECTOMY     FOOT SURGERY Left    HERNIA REPAIR     KNEE SURGERY Right    x2   LAPAROSCOPIC GASTRIC BAND REMOVAL WITH LAPAROSCOPIC GASTRIC SLEEVE RESECTION N/A 03/08/2020   Procedure: LAPAROSCOPIC GASTRIC BAND REMOVAL WITH LAPAROSCOPIC GASTRIC SLEEVE RESECTION, Upper Endo, ERAS Pathway;  Surgeon: Gladis Cough, MD;  Location: [facility];  Service: General;  Laterality: N/A;   LAPAROSCOPIC GASTRIC BANDING     Patient Active Problem List   Diagnosis Date Noted   Chronic left-sided low back pain with left-sided sciatica 10/08/2024   Vitamin B12 deficiency 03/05/2024   Injury of right hand 03/05/2024   Prediabetes 11/20/2023   Vitamin D  deficiency 11/20/2023   Routine general medical examination at a health care facility 11/20/2023   Elevated blood pressure reading 10/18/2023   Anemia 10/18/2023    Hordeolum externum of left upper eyelid 05/24/2022   History of gestational diabetes 04/25/2022   Gastroesophageal reflux disease 04/25/2022   Tobacco abuse 04/25/2022   Alcohol abuse 04/25/2022   Asthma 04/26/2021   Obstructive sleep apnea syndrome 04/26/2021   S/P laparoscopic sleeve gastrectomy 03/08/2020   History of laparoscopic adjustable gastric banding-[facility] in [place]  2013 03/07/2020   Morbid obesity (HCC) 02/27/2020    PCP: Tinnie Harada  REFERRING PROVIDER: Tinnie Harada  REFERRING DIAG:  [phone] (ICD-10-CM) - Chronic left-sided low back pain with left-sided sciatica    Rationale for Evaluation and Treatment: Rehabilitation  THERAPY DIAG:  Radiculopathy, lumbosacral region  Other low back pain  Muscle weakness (generalized)  ONSET DATE: June  SUBJECTIVE:  SUBJECTIVE STATEMENT: Pt states she still experiences most symptoms in the morning when she first wakes up and has some low level back pain upon arrival of PT today. Mostly is experiencing symptoms of stiffness and states the colder weather increases her symptoms of joint stiffness.    Eval: My sciatic nerve has been bothering me since June. I was in [place] and doing cliff jumping, and I jumped 79ft and it has been hurting since.   PERTINENT HISTORY:  Julia Davis is a 38 year old female who presents with persistent back pain radiating to the left thigh. She has experienced persistent back pain since June after jumping from a height of twenty feet into water in [place]. The pain began a day or two after the incident and radiates from her lower back to the left thigh. At its worst, the pain is ten out of ten, currently a four out of ten. In September, she was prescribed a steroid pack and muscle relaxers,  which provided temporary relief. She takes methocarbamol  (Robaxin ) as needed, which is beneficial, but not daily. She is also taking ibuprofen which does help. The pain worsens in the morning, with bending over, and after prolonged walking at work. No fevers or incontinence. She has not engaged in physical therapy but has visited a stretch zone, which provided temporary relief.   PAIN:  Are you having pain? Yes: NPRS scale: comes and goes, at worst 8/10 Pain location: low back, radiates into L leg Pain description: sharp, shooting, uncomfortable to walk, when not high levels of pain it is a dull, constant ache that is annoying Aggravating factors: worse in the morning, walking a lot, prolonged standing and sitting Relieving factors: medicine helps some, seat warmers in car   PRECAUTIONS: None  RED FLAGS: None   WEIGHT BEARING RESTRICTIONS: No  FALLS:  Has patient fallen in last 6 months? Yes. Number of falls 1, I have bad knees   LIVING ENVIRONMENT: Lives with: lives with their family Lives in: House/apartment Stairs: Yes: External: 12 steps; on left going up  OCCUPATION: Comptroller   PLOF: Independent  PATIENT GOALS: stop being in pain, want to be able to go back to the gym   NEXT MD VISIT: 11/24/24  OBJECTIVE:  Note: Objective measures were completed at Evaluation unless otherwise noted.  DIAGNOSTIC FINDINGS:  IMPRESSION: 1. No acute abnormality of the lumbar spine. 2. Mild disc space narrowing at L5-S1. 3. Mild facet arthrosis in the lower lumbar spine.   COGNITION: Overall cognitive status: Within functional limits for tasks assessed     SENSATION: WFL  MUSCLE LENGTH: tightness in B hamstrings and hips, as well as low back mm  POSTURE: rounded shoulders  PALPATION: TTP and pain at L1-L5, increased pain at L4-L5 and at SIJ   LUMBAR ROM:   AROM eval  Flexion WFL a little bit of pain  Extension WFL  Right lateral flexion WFL a little pain  Left  lateral flexion WFL some pain  Right rotation WFL  Left rotation WFL   (Blank rows = not tested)  LOWER EXTREMITY ROM:   WNL  LOWER EXTREMITY MMT:    MMT Right eval Left eval  Hip flexion 4+ 4+  Hip extension    Hip abduction 4+ 4+  Hip adduction    Hip internal rotation    Hip external rotation    Knee flexion 4- 4  Knee extension 4+ 4+  Ankle dorsiflexion    Ankle plantarflexion  Ankle inversion    Ankle eversion     (Blank rows = not tested)  LUMBAR SPECIAL TESTS:  Straight leg raise test: Positive and Slump test: Positive  FUNCTIONAL TESTS:  5 times sit to stand: 15s   TREATMENT DATE:  11/25/24 NuStep L5x 6 min Stability ball roll outs for low back 1x15 Glute Bridges 2x10 Lumbar rotation stretch  Trunk ext Btband 2x10 Seated Rows 2x10 35# Cable pulldowns 2x10 30# RDLs 2x10 5# Lateral band walks Rtband Toe taps 8 2x20 Calf stretch 2x:20s   11/19/24 NuStep L5 x6 min  Cat/Cow Stretch 1x15 Stability ball rollouts for low back stretch Glute Bridges 2x10 Clam Shells 2x10 rtband  Toe Taps on 6 Step 2x10 per  Cable lat pulldowns 2x10 30# Seated Rows 2x10 35#  10/29/24- EVAL                                                                                                                                 PATIENT EDUCATION:  Education details: POC and HEP Person educated: Patient Education method: Medical Illustrator Education comprehension: verbalized understanding and returned demonstration  HOME EXERCISE PROGRAM: Access Code: [email or web address] URL: [email or web address] Date: 10/29/2024 Prepared by: Almetta Fam  Exercises - Supine Lower Trunk Rotation  - 1 x daily - 7 x weekly - 2 sets - 10 reps - 3 hold - Supine Bridge  - 1 x daily - 7 x weekly - 2 sets - 10 reps - Supine Single Knee to Chest Stretch  - 1 x daily - 7 x weekly - 2 reps - 20 hold - Seated Sciatic Nerve Glide With Cervical Motion  - 1 x daily - 7 x weekly - 2 sets  - 10 reps  ASSESSMENT:  CLINICAL IMPRESSION: Pt exhibited some muscle tightness and joint stiffness; initial treatment focused on stretching to gradually warmup musculature for exercise performance. Pt showed good movement quality in lateral stepping and hinge movements at the hip. Pt able to add some weight with exercises without an increase in pain symptoms.   Patient is a 38 y.o. female who was seen today for physical therapy evaluation and treatment for LBP. She has had chronic low back pain with left-sided radiculopathy since June, which started after she jumped off a cliff in [place]. Pain level is currently 3/10, but at worst can reach a 8/10. No red flags on exam. However, she does have classic signs of sciatica that radiate down into her LLE. With palpation she has pain with unilateral posterior to anterior glides along her lumber spine and at the sacrum. Patient will benefit from PT to address her back pain, alleviate sciatic symptoms, and increase her activity tolerance needed for work and to return to the gym.  OBJECTIVE IMPAIRMENTS: decreased activity tolerance, decreased ROM, decreased strength, impaired flexibility, obesity, and pain.   ACTIVITY LIMITATIONS: carrying, lifting, bending, sitting, standing, squatting, and locomotion level  PARTICIPATION LIMITATIONS: cleaning, community activity, occupation, and  yard work  PERSONAL FACTORS: Fitness, Past/current experiences, and Time since onset of injury/illness/exacerbation are also affecting patient's functional outcome.   REHAB POTENTIAL: Good  CLINICAL DECISION MAKING: Stable/uncomplicated  EVALUATION COMPLEXITY: Low  GOALS: Goals reviewed with patient? Yes  SHORT TERM GOALS: Target date: 12/03/24  Patient will be independent with initial HEP.  Baseline:  Goal status: IN PROGRESS 11/19/24  2.  Patient will report centralization of radicular symptoms.  Baseline:  Goal status: IN PROGRESS comes and goes 11/19/24   LONG  TERM GOALS: Target date: 01/07/25  Patient will be independent with advanced/ongoing HEP to improve outcomes and carryover.  Baseline:  Goal status: IN PROGRESS starting to see some carryover in capacity to do more day to day 11/25/24  2.  Patient will report 50-75% improvement in low back pain to improve QOL.  Baseline:  Goal status: IN PROGRESS pain levels still raise without use of pain medication 11/25/24  3.  Patient will demonstrate full pain free lumbar ROM to perform ADLs.   Baseline:  Goal status: IN PROGRESS still experiences pain in most motions at end range 11/25/24  4.  Patient will tolerate 30 min to an hour of (standing/sitting/walking) to perform work and household chores. Baseline:  Goal status: INITIAL  5.  Patient to demonstrate ability to achieve and maintain good spinal alignment/posturing and body mechanics needed to return to gym activities. Baseline:  Goal status: INITIAL   PLAN:  PT FREQUENCY: 1-2x/week  PT DURATION: 10 weeks  PLANNED INTERVENTIONS: 97110-Therapeutic exercises, 97530- Therapeutic activity, [id number]- Neuromuscular re-education, 97535- Self Care, [id number]- Manual therapy, [id number]- Electrical stimulation (unattended), [phone]- Traction (mechanical), 20560 (1-2 muscles), 20561 (3+ muscles)- Dry Needling, Patient/Family education, Balance training, Taping, Joint mobilization, Spinal mobilization, Cryotherapy, and Moist heat.  PLAN FOR NEXT SESSION: manual traction, low back and hip stretching, heat or e-stim if needed for pain    Thersia Alder, Student-PT 11/25/2024, 6:10 PM

## 2024-12-01 ENCOUNTER — Encounter

## 2024-12-04 ENCOUNTER — Ambulatory Visit

## 2024-12-08 ENCOUNTER — Ambulatory Visit: Admitting: Physical Therapy

## 2024-12-13 ENCOUNTER — Ambulatory Visit
Admission: RE | Admit: 2024-12-13 | Discharge: 2024-12-13 | Disposition: A | Source: Ambulatory Visit | Attending: Nurse Practitioner | Admitting: Nurse Practitioner

## 2024-12-13 DIAGNOSIS — G8929 Other chronic pain: Secondary | ICD-10-CM

## 2024-12-23 ENCOUNTER — Ambulatory Visit: Attending: Nurse Practitioner

## 2024-12-23 DIAGNOSIS — M5459 Other low back pain: Secondary | ICD-10-CM | POA: Insufficient documentation

## 2024-12-23 DIAGNOSIS — M6281 Muscle weakness (generalized): Secondary | ICD-10-CM | POA: Insufficient documentation

## 2024-12-23 DIAGNOSIS — M5417 Radiculopathy, lumbosacral region: Secondary | ICD-10-CM | POA: Insufficient documentation

## 2024-12-23 NOTE — Therapy (Signed)
 " OUTPATIENT PHYSICAL THERAPY THORACOLUMBAR TREATMENT   Patient Name: Julia Davis MRN: [id number] DOB:Oct 12, 1986, 39 y.o., female Today's Date: 12/23/2024  END OF SESSION:  PT End of Session - 12/23/24 1754     Visit Number 4    Date for Recertification  01/07/25    Authorization Type [employer]    PT Start Time 1716    PT Stop Time 1756    PT Time Calculation (min) 40 min    Activity Tolerance Patient tolerated treatment well    Behavior During Therapy WFL for tasks assessed/performed          Past Medical History:  Diagnosis Date   Abnormal Pap smear of cervix    age 28-neg colposcopy--no treatment   Arthritis of knee, left    Asthma    mild, seasonal   Diabetes mellitus without complication (HCC)    gestational only   Dysmenorrhea    History of hiatal hernia    history of   History of iron deficiency anemia    Low vitamin D  level 2020   OSA on CPAP    history of, lost weight   STD (sexually transmitted disease)    Hx HSV II   Trichomonas infection 2020   Past Surgical History:  Procedure Laterality Date   CESAREAN SECTION  2006, 2012   delivered in [place]    CHOLECYSTECTOMY     FOOT SURGERY Left    HERNIA REPAIR     KNEE SURGERY Right    x2   LAPAROSCOPIC GASTRIC BAND REMOVAL WITH LAPAROSCOPIC GASTRIC SLEEVE RESECTION N/A 03/08/2020   Procedure: LAPAROSCOPIC GASTRIC BAND REMOVAL WITH LAPAROSCOPIC GASTRIC SLEEVE RESECTION, Upper Endo, ERAS Pathway;  Surgeon: Gladis Cough, MD;  Location: [facility];  Service: General;  Laterality: N/A;   LAPAROSCOPIC GASTRIC BANDING     Patient Active Problem List   Diagnosis Date Noted   Chronic left-sided low back pain with left-sided sciatica 10/08/2024   Vitamin B12 deficiency 03/05/2024   Injury of right hand 03/05/2024   Prediabetes 11/20/2023   Vitamin D  deficiency 11/20/2023   Routine general medical examination at a health care facility 11/20/2023   Elevated blood pressure reading 10/18/2023   Anemia  10/18/2023   Hordeolum externum of left upper eyelid 05/24/2022   History of gestational diabetes 04/25/2022   Gastroesophageal reflux disease 04/25/2022   Tobacco abuse 04/25/2022   Alcohol abuse 04/25/2022   Asthma 04/26/2021   Obstructive sleep apnea syndrome 04/26/2021   S/P laparoscopic sleeve gastrectomy 03/08/2020   History of laparoscopic adjustable gastric banding-[facility] in [place]  2013 03/07/2020   Morbid obesity (HCC) 02/27/2020    PCP: Tinnie Harada  REFERRING PROVIDER: Tinnie Harada  REFERRING DIAG:  [phone] (ICD-10-CM) - Chronic left-sided low back pain with left-sided sciatica    Rationale for Evaluation and Treatment: Rehabilitation  THERAPY DIAG:  Radiculopathy, lumbosacral region  Other low back pain  Muscle weakness (generalized)  ONSET DATE: June  SUBJECTIVE:  SUBJECTIVE STATEMENT: Pt with no reported back pain today; pt stated she was able to go to the gym for the first time this past Sunday. Pt reported no back pain with squatting movements and only has back pain when moving quickly throughout the day without thinking about body mechanics.    Eval: My sciatic nerve has been bothering me since June. I was in [place] and doing cliff jumping, and I jumped 12ft and it has been hurting since.   PERTINENT HISTORY:  Keyuna Cuthrell is a 39 year old female who presents with persistent back pain radiating to the left thigh. She has experienced persistent back pain since June after jumping from a height of twenty feet into water in [place]. The pain began a day or two after the incident and radiates from her lower back to the left thigh. At its worst, the pain is ten out of ten, currently a four out of ten. In September, she was prescribed a steroid pack and  muscle relaxers, which provided temporary relief. She takes methocarbamol  (Robaxin ) as needed, which is beneficial, but not daily. She is also taking ibuprofen which does help. The pain worsens in the morning, with bending over, and after prolonged walking at work. No fevers or incontinence. She has not engaged in physical therapy but has visited a stretch zone, which provided temporary relief.   PAIN:  Are you having pain? Yes: NPRS scale: comes and goes, at worst 8/10 Pain location: low back, radiates into L leg Pain description: sharp, shooting, uncomfortable to walk, when not high levels of pain it is a dull, constant ache that is annoying Aggravating factors: worse in the morning, walking a lot, prolonged standing and sitting Relieving factors: medicine helps some, seat warmers in car   PRECAUTIONS: None  RED FLAGS: None   WEIGHT BEARING RESTRICTIONS: No  FALLS:  Has patient fallen in last 6 months? Yes. Number of falls 1, I have bad knees   LIVING ENVIRONMENT: Lives with: lives with their family Lives in: House/apartment Stairs: Yes: External: 12 steps; on left going up  OCCUPATION: Comptroller   PLOF: Independent  PATIENT GOALS: stop being in pain, want to be able to go back to the gym   NEXT MD VISIT: 11/24/24  OBJECTIVE:  Note: Objective measures were completed at Evaluation unless otherwise noted.  DIAGNOSTIC FINDINGS:  IMPRESSION: 1. No acute abnormality of the lumbar spine. 2. Mild disc space narrowing at L5-S1. 3. Mild facet arthrosis in the lower lumbar spine.   COGNITION: Overall cognitive status: Within functional limits for tasks assessed     SENSATION: WFL  MUSCLE LENGTH: tightness in B hamstrings and hips, as well as low back mm  POSTURE: rounded shoulders  PALPATION: TTP and pain at L1-L5, increased pain at L4-L5 and at SIJ   LUMBAR ROM:   AROM eval  Flexion WFL a little bit of pain  Extension WFL  Right lateral flexion WFL a little  pain  Left lateral flexion WFL some pain  Right rotation WFL  Left rotation WFL   (Blank rows = not tested)  LOWER EXTREMITY ROM:   WNL  LOWER EXTREMITY MMT:    MMT Right eval Left eval  Hip flexion 4+ 4+  Hip extension    Hip abduction 4+ 4+  Hip adduction    Hip internal rotation    Hip external rotation    Knee flexion 4- 4  Knee extension 4+ 4+  Ankle dorsiflexion  Ankle plantarflexion    Ankle inversion    Ankle eversion     (Blank rows = not tested)  LUMBAR SPECIAL TESTS:  Straight leg raise test: Positive and Slump test: Positive  FUNCTIONAL TESTS:  5 times sit to stand: 15s   TREATMENT DATE:  12/23/24 NuStep L5 x 6 min  SB rollouts  Toe Taps 6 2x20 Cable Pulldowns 2x10 30# Pallof Press 2x10 15#  RDLs 2x10 2# body bar then w/ 9# DBs Glute Bridges elevated on SB 2x10 SLR 2x10 HS stretch Lumbar rotation stretch Knee to chest stretch  11/25/24 NuStep L5x 6 min Stability ball roll outs for low back 1x15 Glute Bridges 2x10 Lumbar rotation stretch  Trunk ext Btband 2x10 Seated Rows 2x10 35# Cable pulldowns 2x10 30# RDLs 2x10 5# Lateral band walks Rtband Toe taps 8 2x20 Calf stretch 2x:20s   11/19/24 NuStep L5 x6 min  Cat/Cow Stretch 1x15 Stability ball rollouts for low back stretch Glute Bridges 2x10 Clam Shells 2x10 rtband  Toe Taps on 6 Step 2x10 per  Cable lat pulldowns 2x10 30# Seated Rows 2x10 35#  10/29/24- EVAL                                                                                                                                 PATIENT EDUCATION:  Education details: POC and HEP Person educated: Patient Education method: Medical Illustrator Education comprehension: verbalized understanding and returned demonstration  HOME EXERCISE PROGRAM: Access Code: [email or web address] URL: [email or web address] Date: 10/29/2024 Prepared by: Almetta Fam  Exercises - Supine Lower Trunk Rotation  - 1 x daily - 7 x  weekly - 2 sets - 10 reps - 3 hold - Supine Bridge  - 1 x daily - 7 x weekly - 2 sets - 10 reps - Supine Single Knee to Chest Stretch  - 1 x daily - 7 x weekly - 2 reps - 20 hold - Seated Sciatic Nerve Glide With Cervical Motion  - 1 x daily - 7 x weekly - 2 sets - 10 reps  ASSESSMENT:  CLINICAL IMPRESSION: Pt still exhibits some low back stiffness; session included stretching and movement that decreased muscle tightness. Pt with good body mechanics during hinge movements like performing RDLs with light weight. Pt with ability to increase standing activity and exercises that include resistance and some light weight training without an increase in pain. Pt will continue to benefit from stretching and functional activity to mitigate low back stiffness and build confidence to perform activities in the gym.   Patient is a 39 y.o. female who was seen today for physical therapy evaluation and treatment for LBP. She has had chronic low back pain with left-sided radiculopathy since June, which started after she jumped off a cliff in [place]. Pain level is currently 3/10, but at worst can reach a 8/10. No red flags on exam. However, she does have classic signs of sciatica that radiate down into her  LLE. With palpation she has pain with unilateral posterior to anterior glides along her lumber spine and at the sacrum. Patient will benefit from PT to address her back pain, alleviate sciatic symptoms, and increase her activity tolerance needed for work and to return to the gym.  OBJECTIVE IMPAIRMENTS: decreased activity tolerance, decreased ROM, decreased strength, impaired flexibility, obesity, and pain.   ACTIVITY LIMITATIONS: carrying, lifting, bending, sitting, standing, squatting, and locomotion level  PARTICIPATION LIMITATIONS: cleaning, community activity, occupation, and yard work  PERSONAL FACTORS: Fitness, Past/current experiences, and Time since onset of injury/illness/exacerbation are also affecting  patient's functional outcome.   REHAB POTENTIAL: Good  CLINICAL DECISION MAKING: Stable/uncomplicated  EVALUATION COMPLEXITY: Low  GOALS: Goals reviewed with patient? Yes  SHORT TERM GOALS: Target date: 12/03/24  Patient will be independent with initial HEP.  Baseline:  Goal status: IN PROGRESS doing ~50% 12/23/24  2.  Patient will report centralization of radicular symptoms.  Baseline:  Goal status: IN PROGRESS comes and goes 11/19/24   LONG TERM GOALS: Target date: 01/07/25  Patient will be independent with advanced/ongoing HEP to improve outcomes and carryover.  Baseline:  Goal status: IN PROGRESS starting to see some carryover in capacity to do more day to day 11/25/24; IN PROGRESS 12/23/24  2.  Patient will report 50-75% improvement in low back pain to improve QOL.  Baseline:  Goal status: IN PROGRESS pain levels still raise without use of pain medication 11/25/24; In Progress <50% 12/23/24  3.  Patient will demonstrate full pain free lumbar ROM to perform ADLs.   Baseline:  Goal status: IN PROGRESS still experiences pain in most motions at end range 11/25/24; Bending forward still painful 12/23/24  4.  Patient will tolerate 30 min to an hour of (standing/sitting/walking) to perform work and household chores. Baseline:  Goal status: IN PROGRESS prolonged sitting only aggravating factor 12/23/2024  5.  Patient to demonstrate ability to achieve and maintain good spinal alignment/posturing and body mechanics needed to return to gym activities. Baseline:  Goal status: IN PROGRESS went to the gym the first time this past Sunday 12/23/2024   PLAN:  PT FREQUENCY: 1-2x/week  PT DURATION: 10 weeks  PLANNED INTERVENTIONS: 97110-Therapeutic exercises, 97530- Therapeutic activity, 97112- Neuromuscular re-education, 97535- Self Care, 02859- Manual therapy, G0283- Electrical stimulation (unattended), [phone]- Traction (mechanical), 20560 (1-2 muscles), 20561 (3+ muscles)- Dry Needling,  Patient/Family education, Balance training, Taping, Joint mobilization, Spinal mobilization, Cryotherapy, and Moist heat.  PLAN FOR NEXT SESSION: manual traction, low back and hip stretching, heat or e-stim if needed for pain    Thersia Alder, Student-PT 12/23/2024, 5:57 PM  "

## 2024-12-24 ENCOUNTER — Ambulatory Visit: Payer: Self-pay | Admitting: Nurse Practitioner

## 2024-12-24 DIAGNOSIS — G8929 Other chronic pain: Secondary | ICD-10-CM

## 2024-12-30 ENCOUNTER — Ambulatory Visit

## 2024-12-30 DIAGNOSIS — M5417 Radiculopathy, lumbosacral region: Secondary | ICD-10-CM

## 2024-12-30 DIAGNOSIS — M6281 Muscle weakness (generalized): Secondary | ICD-10-CM

## 2024-12-30 DIAGNOSIS — M5459 Other low back pain: Secondary | ICD-10-CM

## 2024-12-30 NOTE — Therapy (Signed)
 " OUTPATIENT PHYSICAL THERAPY THORACOLUMBAR TREATMENT   Patient Name: Julia Davis MRN: [id number] DOB:09/27/1986, 39 y.o., female Today's Date: 12/30/2024  END OF SESSION:  PT End of Session - 12/30/24 1734     Visit Number 5    Date for Recertification  01/07/25    Authorization Type [employer]    PT Start Time 1715    PT Stop Time 1755    PT Time Calculation (min) 40 min    Activity Tolerance Patient tolerated treatment well    Behavior During Therapy WFL for tasks assessed/performed         Past Medical History:  Diagnosis Date   Abnormal Pap smear of cervix    age 69-neg colposcopy--no treatment   Arthritis of knee, left    Asthma    mild, seasonal   Diabetes mellitus without complication (HCC)    gestational only   Dysmenorrhea    History of hiatal hernia    history of   History of iron deficiency anemia    Low vitamin D  level 2020   OSA on CPAP    history of, lost weight   STD (sexually transmitted disease)    Hx HSV II   Trichomonas infection 2020   Past Surgical History:  Procedure Laterality Date   CESAREAN SECTION  2006, 2012   delivered in [place]    CHOLECYSTECTOMY     FOOT SURGERY Left    HERNIA REPAIR     KNEE SURGERY Right    x2   LAPAROSCOPIC GASTRIC BAND REMOVAL WITH LAPAROSCOPIC GASTRIC SLEEVE RESECTION N/A 03/08/2020   Procedure: LAPAROSCOPIC GASTRIC BAND REMOVAL WITH LAPAROSCOPIC GASTRIC SLEEVE RESECTION, Upper Endo, ERAS Pathway;  Surgeon: Gladis Cough, MD;  Location: [facility];  Service: General;  Laterality: N/A;   LAPAROSCOPIC GASTRIC BANDING     Patient Active Problem List   Diagnosis Date Noted   Chronic left-sided low back pain with left-sided sciatica 10/08/2024   Vitamin B12 deficiency 03/05/2024   Injury of right hand 03/05/2024   Prediabetes 11/20/2023   Vitamin D  deficiency 11/20/2023   Routine general medical examination at a health care facility 11/20/2023   Elevated blood pressure reading 10/18/2023   Anemia 10/18/2023    Hordeolum externum of left upper eyelid 05/24/2022   History of gestational diabetes 04/25/2022   Gastroesophageal reflux disease 04/25/2022   Tobacco abuse 04/25/2022   Alcohol abuse 04/25/2022   Asthma 04/26/2021   Obstructive sleep apnea syndrome 04/26/2021   S/P laparoscopic sleeve gastrectomy 03/08/2020   History of laparoscopic adjustable gastric banding-[facility] in [place]  2013 03/07/2020   Morbid obesity (HCC) 02/27/2020    PCP: Tinnie Harada  REFERRING PROVIDER: Tinnie Harada  REFERRING DIAG:  [phone] (ICD-10-CM) - Chronic left-sided low back pain with left-sided sciatica    Rationale for Evaluation and Treatment: Rehabilitation  THERAPY DIAG:  Radiculopathy, lumbosacral region  Other low back pain  Muscle weakness (generalized)  ONSET DATE: June  SUBJECTIVE:  SUBJECTIVE STATEMENT: Pt reported 3/10 LBP today; stated she has done some exercises at home since the last visit.    Eval: My sciatic nerve has been bothering me since June. I was in [place] and doing cliff jumping, and I jumped 19ft and it has been hurting since.   PERTINENT HISTORY:  Julia Davis is a 39 year old female who presents with persistent back pain radiating to the left thigh. She has experienced persistent back pain since June after jumping from a height of twenty feet into water in [place]. The pain began a day or two after the incident and radiates from her lower back to the left thigh. At its worst, the pain is ten out of ten, currently a four out of ten. In September, she was prescribed a steroid pack and muscle relaxers, which provided temporary relief. She takes methocarbamol  (Robaxin ) as needed, which is beneficial, but not daily. She is also taking ibuprofen which does help. The pain  worsens in the morning, with bending over, and after prolonged walking at work. No fevers or incontinence. She has not engaged in physical therapy but has visited a stretch zone, which provided temporary relief.   PAIN:  Are you having pain? Yes: NPRS scale: comes and goes, at worst 8/10 Pain location: low back, radiates into L leg Pain description: sharp, shooting, uncomfortable to walk, when not high levels of pain it is a dull, constant ache that is annoying Aggravating factors: worse in the morning, walking a lot, prolonged standing and sitting Relieving factors: medicine helps some, seat warmers in car   PRECAUTIONS: None  RED FLAGS: None   WEIGHT BEARING RESTRICTIONS: No  FALLS:  Has patient fallen in last 6 months? Yes. Number of falls 1, I have bad knees   LIVING ENVIRONMENT: Lives with: lives with their family Lives in: House/apartment Stairs: Yes: External: 12 steps; on left going up  OCCUPATION: Comptroller   PLOF: Independent  PATIENT GOALS: stop being in pain, want to be able to go back to the gym   NEXT MD VISIT: 11/24/24  OBJECTIVE:  Note: Objective measures were completed at Evaluation unless otherwise noted.  DIAGNOSTIC FINDINGS:  IMPRESSION: 1. No acute abnormality of the lumbar spine. 2. Mild disc space narrowing at L5-S1. 3. Mild facet arthrosis in the lower lumbar spine.   COGNITION: Overall cognitive status: Within functional limits for tasks assessed     SENSATION: WFL  MUSCLE LENGTH: tightness in B hamstrings and hips, as well as low back mm  POSTURE: rounded shoulders  PALPATION: TTP and pain at L1-L5, increased pain at L4-L5 and at SIJ   LUMBAR ROM:   AROM eval  Flexion WFL a little bit of pain  Extension WFL  Right lateral flexion WFL a little pain  Left lateral flexion WFL some pain  Right rotation WFL  Left rotation WFL   (Blank rows = not tested)  LOWER EXTREMITY ROM:   WNL  LOWER EXTREMITY MMT:    MMT Right eval  Left eval  Hip flexion 4+ 4+  Hip extension    Hip abduction 4+ 4+  Hip adduction    Hip internal rotation    Hip external rotation    Knee flexion 4- 4  Knee extension 4+ 4+  Ankle dorsiflexion    Ankle plantarflexion    Ankle inversion    Ankle eversion     (Blank rows = not tested)  LUMBAR SPECIAL TESTS:  Straight leg raise test: Positive  and Slump test: Positive  FUNCTIONAL TESTS:  5 times sit to stand: 15s   TREATMENT DATE:  12/30/24 NuStep L5 x 6 min  Trunk ext Btband 2x10 SB rollouts (flex/lateral rotation)  Cable Core Rotations 15# 2x10 Toe Taps 8 1x10 Step Ups 8' 1x10 Resisted Lateral walking 20# Calf Stretch Elevated platform  HS Stretch Knee to Chest stretch  Windshield Wipers lumbar stretch    12/23/24 NuStep L5 x 6 min  SB rollouts  Toe Taps 6 2x20 Cable Pulldowns 2x10 30# Pallof Press 2x10 15#  RDLs 2x10 2# body bar then w/ 9# DBs Glute Bridges elevated on SB 2x10 SLR 2x10 HS stretch Lumbar rotation stretch Knee to chest stretch  11/25/24 NuStep L5x 6 min Stability ball roll outs for low back 1x15 Glute Bridges 2x10 Lumbar rotation stretch  Trunk ext Btband 2x10 Seated Rows 2x10 35# Cable pulldowns 2x10 30# RDLs 2x10 5# Lateral band walks Rtband Toe taps 8 2x20 Calf stretch 2x:20s   11/19/24 NuStep L5 x6 min  Cat/Cow Stretch 1x15 Stability ball rollouts for low back stretch Glute Bridges 2x10 Clam Shells 2x10 rtband  Toe Taps on 6 Step 2x10 per  Cable lat pulldowns 2x10 30# Seated Rows 2x10 35#  10/29/24- EVAL                                                                                                                                 PATIENT EDUCATION:  Education details: POC and HEP Person educated: Patient Education method: Medical Illustrator Education comprehension: verbalized understanding and returned demonstration  HOME EXERCISE PROGRAM: Access Code: [email or web address] URL:  [email or web address] Date: 10/29/2024 Prepared by: Almetta Fam  Exercises - Supine Lower Trunk Rotation  - 1 x daily - 7 x weekly - 2 sets - 10 reps - 3 hold - Supine Bridge  - 1 x daily - 7 x weekly - 2 sets - 10 reps - Supine Single Knee to Chest Stretch  - 1 x daily - 7 x weekly - 2 reps - 20 hold - Seated Sciatic Nerve Glide With Cervical Motion  - 1 x daily - 7 x weekly - 2 sets - 10 reps  ASSESSMENT:  CLINICAL IMPRESSION: Pt still exhibits some low back stiffness and low level LBP. Session continued to focus on functional movements and included stretching. Pt will continue to benefit from stretching and functional activity to mitigate low back stiffness and build confidence to perform activities in the gym.   Patient is a 39 y.o. female who was seen today for physical therapy evaluation and treatment for LBP. She has had chronic low back pain with left-sided radiculopathy since June, which started after she jumped off a cliff in [place]. Pain level is currently 3/10, but at worst can reach a 8/10. No red flags on exam. However, she does have classic signs of sciatica that radiate down into her LLE. With palpation she has pain with unilateral posterior to  anterior glides along her lumber spine and at the sacrum. Patient will benefit from PT to address her back pain, alleviate sciatic symptoms, and increase her activity tolerance needed for work and to return to the gym.  OBJECTIVE IMPAIRMENTS: decreased activity tolerance, decreased ROM, decreased strength, impaired flexibility, obesity, and pain.   ACTIVITY LIMITATIONS: carrying, lifting, bending, sitting, standing, squatting, and locomotion level  PARTICIPATION LIMITATIONS: cleaning, community activity, occupation, and yard work  PERSONAL FACTORS: Fitness, Past/current experiences, and Time since onset of injury/illness/exacerbation are also affecting patient's functional outcome.   REHAB POTENTIAL: Good  CLINICAL  DECISION MAKING: Stable/uncomplicated  EVALUATION COMPLEXITY: Low  GOALS: Goals reviewed with patient? Yes  SHORT TERM GOALS: Target date: 12/03/24  Patient will be independent with initial HEP.  Baseline:  Goal status: IN PROGRESS ~75% 12/30/24  2.  Patient will report centralization of radicular symptoms.  Baseline:  Goal status: IN PROGRESS comes and goes but seems to be more central today 12/30/24   LONG TERM GOALS: Target date: 01/07/25  Patient will be independent with advanced/ongoing HEP to improve outcomes and carryover.  Baseline:  Goal status: IN PROGRESS starting to see some carryover in capacity to do more day to day 11/25/24; IN PROGRESS 12/30/24  2.  Patient will report 50-75% improvement in low back pain to improve QOL.  Baseline:  Goal status: IN PROGRESS pain levels still raise without use of pain medication 11/25/24; In Progress <50% 12/23/24  3.  Patient will demonstrate full pain free lumbar ROM to perform ADLs.   Baseline:  Goal status: IN PROGRESS still experiences pain in most motions at end range 11/25/24; Bending forward still painful 12/23/24  4.  Patient will tolerate 30 min to an hour of (standing/sitting/walking) to perform work and household chores. Baseline:  Goal status: IN PROGRESS prolonged sitting only aggravating factor 12/30/2024  5.  Patient to demonstrate ability to achieve and maintain good spinal alignment/posturing and body mechanics needed to return to gym activities. Baseline:  Goal status: IN PROGRESS went to the gym the first time this past Sunday 12/23/2024   PLAN:  PT FREQUENCY: 1-2x/week  PT DURATION: 10 weeks  PLANNED INTERVENTIONS: 97110-Therapeutic exercises, 97530- Therapeutic activity, 97112- Neuromuscular re-education, 97535- Self Care, 02859- Manual therapy, G0283- Electrical stimulation (unattended), [phone]- Traction (mechanical), 20560 (1-2 muscles), 20561 (3+ muscles)- Dry Needling, Patient/Family education, Balance training,  Taping, Joint mobilization, Spinal mobilization, Cryotherapy, and Moist heat.  PLAN FOR NEXT SESSION: manual traction, low back and hip stretching, heat or e-stim if needed for pain    Thersia Alder, Student-PT 12/30/2024, 5:57 PM  "

## 2025-01-06 ENCOUNTER — Ambulatory Visit: Admitting: Physical Therapy

## 2025-01-14 ENCOUNTER — Ambulatory Visit

## 2025-01-19 ENCOUNTER — Telehealth: Payer: Self-pay | Admitting: Neurology

## 2025-01-19 NOTE — Telephone Encounter (Signed)
 Appt cancelled due to office delayed opening.

## 2025-01-20 ENCOUNTER — Ambulatory Visit: Admitting: Neurology

## 2025-01-21 NOTE — Progress Notes (Unsigned)
 "  Referring Physician:  Nedra Tinnie LABOR, NP [address] [place],  [place] [address]  Primary Physician:  Nedra Tinnie LABOR, NP  History of Present Illness: 01/21/2025 [name] is here today with a chief complaint of ***  Chronic left-sided low back pain with left-sided sciatica Weakness Any tingling or numbness?   Duration: *** Location: *** Quality: *** Severity: ***  Precipitating: aggravated by *** Modifying factors: made better by *** Weakness: none Timing: *** Bowel/Bladder Dysfunction: none  Conservative measures:  Physical therapy: *** Is participating in @[facility] 10/29/2024-? Multimodal medical therapy including regular antiinflammatories: *** methocarbamol , gabapentin  Injections: *** epidural steroid injections?  Past Surgery: ***  Julia Davis has ***no symptoms of cervical myelopathy.  The symptoms are causing a significant impact on the patient's life.   Review of Systems:  A 10 point review of systems is negative, except for the pertinent positives and negatives detailed in the HPI.  Past Medical History: Past Medical History:  Diagnosis Date   Abnormal Pap smear of cervix    age 73-neg colposcopy--no treatment   Arthritis of knee, left    Asthma    mild, seasonal   Diabetes mellitus without complication (HCC)    gestational only   Dysmenorrhea    History of hiatal hernia    history of   History of iron deficiency anemia    Low vitamin D  level 2020   OSA on CPAP    history of, lost weight   STD (sexually transmitted disease)    Hx HSV II   Trichomonas infection 2020    Past Surgical History: Past Surgical History:  Procedure Laterality Date   CESAREAN SECTION  2006, 2012   delivered in [place]    CHOLECYSTECTOMY     FOOT SURGERY Left    HERNIA REPAIR     KNEE SURGERY Right    x2   LAPAROSCOPIC GASTRIC BAND REMOVAL WITH LAPAROSCOPIC GASTRIC SLEEVE RESECTION N/A 03/08/2020   Procedure: LAPAROSCOPIC  GASTRIC BAND REMOVAL WITH LAPAROSCOPIC GASTRIC SLEEVE RESECTION, Upper Endo, ERAS Pathway;  Surgeon: Gladis Cough, MD;  Location: [facility];  Service: General;  Laterality: N/A;   LAPAROSCOPIC GASTRIC BANDING      Allergies: Allergies as of 01/27/2025   (No Known Allergies)    Medications: Outpatient Encounter Medications as of 01/27/2025  Medication Sig   gabapentin  (NEURONTIN ) 300 MG capsule Take 1 capsule (300 mg total) by mouth 2 (two) times daily.   methocarbamol  (ROBAXIN ) 500 MG tablet Take 1 tablet (500 mg total) by mouth every 8 (eight) hours as needed for muscle spasms.   omeprazole (PRILOSEC) 20 MG capsule Take 20 mg by mouth 2 (two) times daily.   No facility-administered encounter medications on file as of 01/27/2025.    Social History: Social History[1]  Family Medical History: Family History  Problem Relation Age of Onset   Osteoarthritis Mother    Diabetes Father    Diabetes Maternal Grandfather    Hypertension Maternal Grandfather     Physical Examination: @VITALWITHPAIN @  General: Patient is well developed, well nourished, calm, collected, and in no apparent distress. Attention to examination is appropriate.  Psychiatric: Patient is non-anxious.  Head:  Pupils equal, round, and reactive to light.  ENT:  Oral mucosa appears well hydrated.  Neck:   Supple.  ***Full range of motion.  Respiratory: Patient is breathing without any difficulty.  Extremities: No edema.  Vascular: Palpable dorsal pedal pulses.  Skin:   On exposed skin, there are no abnormal  skin lesions.  NEUROLOGICAL:     Awake, alert, oriented to person, place, and time.  Speech is clear and fluent. Fund of knowledge is appropriate.   Cranial Nerves: Pupils equal round and reactive to light.  Facial tone is symmetric.  Facial sensation is symmetric.  ROM of spine: ***full.  Palpation of spine: ***non tender.    Strength: Side Biceps Triceps Deltoid Interossei Grip Wrist Ext. Wrist  Flex.  R 5 5 5 5 5 5 5   L 5 5 5 5 5 5 5    Side Iliopsoas Quads Hamstring PF DF EHL  R 5 5 5 5 5 5   L 5 5 5 5 5 5    Reflexes are ***2+ and symmetric at the biceps, triceps, brachioradialis, patella and achilles.   Hoffman's is absent.  Clonus is not present.  Toes are down-going.  Bilateral upper and lower extremity sensation is intact to light touch.    Gait is normal.   No difficulty with tandem gait.   No evidence of dysmetria noted.  Medical Decision Making  Imaging: ***  I have personally reviewed the images and agree with the above interpretation.  Assessment and Plan: [name] is a pleasant 39 y.o. female with ***    Thank you for involving me in the care of this patient.   I spent a total of *** minutes in both face-to-face and non-face-to-face activities for this visit on the date of this encounter.   Julia Decamp, PA-C Dept. of Neurosurgery     [1]  Social History Tobacco Use   Smoking status: Former    Current packs/day: 0.25    Types: Cigarettes   Smokeless tobacco: Current   Tobacco comments:    Vapes Daily  Vaping Use   Vaping status: Every Day   Substances: Nicotine, Flavoring  Substance Use Topics   Alcohol use: Yes    Alcohol/week: 5.0 standard drinks of alcohol    Types: 5 Glasses of wine per week    Comment: Occassionally.   Drug use: Not Currently    Types: Marijuana    Comment: Occassionally.   "

## 2025-01-27 ENCOUNTER — Ambulatory Visit

## 2025-01-27 ENCOUNTER — Ambulatory Visit: Admitting: Physician Assistant

## 2025-01-28 ENCOUNTER — Ambulatory Visit: Admitting: Nurse Practitioner

## 2025-02-05 ENCOUNTER — Ambulatory Visit: Admitting: Physician Assistant
